# Patient Record
Sex: Male | Born: 1965 | Race: White | Hispanic: No | Smoking: Current every day smoker
Health system: Southern US, Community
[De-identification: ages and names within clinical notes are randomized; demographics above are authoritative.]

## PROBLEM LIST (undated history)

## (undated) DIAGNOSIS — E785 Hyperlipidemia, unspecified: Secondary | ICD-10-CM

## (undated) DIAGNOSIS — G459 Transient cerebral ischemic attack, unspecified: Secondary | ICD-10-CM

## (undated) DIAGNOSIS — I700 Atherosclerosis of aorta: Secondary | ICD-10-CM

## (undated) DIAGNOSIS — I2510 Atherosclerotic heart disease of native coronary artery without angina pectoris: Secondary | ICD-10-CM

## (undated) DIAGNOSIS — I10 Essential (primary) hypertension: Secondary | ICD-10-CM

## (undated) HISTORY — DX: Atherosclerosis of aorta: I70.0

## (undated) HISTORY — DX: Transient cerebral ischemic attack, unspecified: G45.9

## (undated) HISTORY — DX: Hyperlipidemia, unspecified: E78.5

## (undated) HISTORY — DX: Atherosclerotic heart disease of native coronary artery without angina pectoris: I25.10

---

## 2003-04-08 ENCOUNTER — Emergency Department (HOSPITAL_COMMUNITY): Admission: EM | Admit: 2003-04-08 | Discharge: 2003-04-08 | Payer: Self-pay | Admitting: Emergency Medicine

## 2004-07-17 ENCOUNTER — Encounter: Admission: RE | Admit: 2004-07-17 | Discharge: 2004-07-17 | Payer: Self-pay | Admitting: Emergency Medicine

## 2004-07-22 ENCOUNTER — Encounter: Admission: RE | Admit: 2004-07-22 | Discharge: 2004-07-22 | Payer: Self-pay | Admitting: Emergency Medicine

## 2011-06-22 ENCOUNTER — Other Ambulatory Visit: Payer: Self-pay

## 2011-06-22 ENCOUNTER — Encounter (HOSPITAL_COMMUNITY): Payer: Self-pay

## 2011-06-22 ENCOUNTER — Emergency Department (HOSPITAL_COMMUNITY)
Admission: EM | Admit: 2011-06-22 | Discharge: 2011-06-22 | Disposition: A | Discharge status: Home / Self Care | Payer: 59 | Attending: Emergency Medicine | Admitting: Emergency Medicine

## 2011-06-22 ENCOUNTER — Emergency Department (HOSPITAL_COMMUNITY): Payer: 59

## 2011-06-22 DIAGNOSIS — R0602 Shortness of breath: Secondary | ICD-10-CM | POA: Insufficient documentation

## 2011-06-22 DIAGNOSIS — I10 Essential (primary) hypertension: Secondary | ICD-10-CM | POA: Insufficient documentation

## 2011-06-22 DIAGNOSIS — R093 Abnormal sputum: Secondary | ICD-10-CM | POA: Insufficient documentation

## 2011-06-22 DIAGNOSIS — R059 Cough, unspecified: Secondary | ICD-10-CM | POA: Insufficient documentation

## 2011-06-22 DIAGNOSIS — J40 Bronchitis, not specified as acute or chronic: Secondary | ICD-10-CM

## 2011-06-22 DIAGNOSIS — R079 Chest pain, unspecified: Secondary | ICD-10-CM | POA: Insufficient documentation

## 2011-06-22 DIAGNOSIS — R42 Dizziness and giddiness: Secondary | ICD-10-CM | POA: Insufficient documentation

## 2011-06-22 DIAGNOSIS — R000 Tachycardia, unspecified: Secondary | ICD-10-CM | POA: Insufficient documentation

## 2011-06-22 DIAGNOSIS — R05 Cough: Secondary | ICD-10-CM | POA: Insufficient documentation

## 2011-06-22 HISTORY — DX: Essential (primary) hypertension: I10

## 2011-06-22 MED ORDER — BENZONATATE 100 MG PO CAPS: 100.0000 mg | ORAL_CAPSULE | Freq: Three times a day (TID) | ORAL | Status: AC

## 2011-06-22 NOTE — ED Provider Notes (Addendum)
History   This chart was scribed for Frederick Guppy, MD by Melba Coon. The patient was seen in room STRE6/STRE6 and the patient's care was started at 2:04PM.   CSN: [id number]  Arrival date & time 06/22/11  1312   None     Chief Complaint  Patient presents with  . Shortness of Breath    (Consider location/radiation/quality/duration/timing/severity/associated sxs/prior treatment) HPI Frederick Alvarez is a 46 y.o. male who presents to the Emergency Department complaining of persistent, moderate to severe shortness of breath with associated productive cough with an onset yesterday. Pt is producing non-bloody, light green phlegm. Pt's kids have had URI-type sx at home. Lightheadedness and some CP from coughing present. No HA, fever, sweats, neck pain, rash, back pain, abd pain, n/v/d, dysuria, or extremity pain, edema, weakness, numbness, or tingling. No known allergies. Hx of HTN. No other pertinent medical symptoms. Pt is current everyday smoker.   Past Medical History  Diagnosis Date  . Hypertension     History reviewed. No pertinent past surgical history.  History reviewed. No pertinent family history.  History  Substance Use Topics  . Smoking status: Current Everyday Smoker  . Smokeless tobacco: Not on file  . Alcohol Use: Yes     daily beer      Review of Systems 10 Systems reviewed and all are negative for acute change except as noted in the HPI.   Allergies  Review of patient's allergies indicates no known allergies.  Home Medications   Current Outpatient Rx  Name Route Sig Dispense Refill  . AMLODIPINE BESYLATE 5 MG PO TABS Oral Take 5 mg by mouth daily.    Marland Kitchen HYZAAR PO Oral Take 1 tablet by mouth daily.      BP 119/78  Pulse 103  Temp(Src) 97.7 F (36.5 C) (Oral)  Resp 20  SpO2 99%  Physical Exam  Nursing note and vitals reviewed. Constitutional: He is oriented to person, place, and time. He appears well-developed and well-nourished. No  distress.  HENT:  Head: Normocephalic and atraumatic.  Eyes: EOM are normal. Pupils are equal, round, and reactive to light.  Neck: Normal range of motion. Neck supple. No tracheal deviation present.  Cardiovascular: Regular rhythm and normal heart sounds.  Tachycardia present.   No murmur heard. Pulmonary/Chest: Effort normal and breath sounds normal. No respiratory distress.       Lungs sounds are clear and equal bilaterally  Abdominal: Soft. There is no tenderness.  Musculoskeletal: Normal range of motion. He exhibits no tenderness.  Neurological: He is alert and oriented to person, place, and time.  Skin: Skin is warm and dry. No rash noted.  Psychiatric: He has a normal mood and affect. His behavior is normal.    ED Course  Procedures (including critical care time) Productive cough and sob in smoker. No pain. No resp distress or toxicity.  Suspect bronchitis. Will do cxr.    DIAGNOSTIC STUDIES: Oxygen Saturation is 99% on room air, normal by my interpretation.    COORDINATION OF CARE:  2:08PM - EDMD will order CXR for the pt.   Labs Reviewed - No data to display No results found.   No diagnosis found.    MDM  Bronchitis Or distress, hypoxia or toxicity. I personally performed the services described in this documentation, which was scribed in my presence. The recorded information has been reviewed and considered.   I personally performed the services described in this documentation, which was scribed in my presence. The recorded information  has been reviewed and considered.      Frederick Guppy, MD 06/22/11 1443  Frederick Guppy, MD 06/22/11 [phone]

## 2011-06-22 NOTE — Discharge Instructions (Signed)
Your chest x-ray does not show pneumonia.  Your oxygen levels are normal.  Stop smoking.  Use Tessalon for cough.  Followup with your Dr. if your symptoms.  Last more than 3-4 days.  Return for worse or uncontrolled symptoms

## 2011-06-22 NOTE — ED Notes (Signed)
Yesterday head congested, coughing green phleghm, lethagic

## 2012-11-11 ENCOUNTER — Emergency Department (HOSPITAL_COMMUNITY): Payer: 59

## 2012-11-11 ENCOUNTER — Encounter (HOSPITAL_COMMUNITY): Payer: Self-pay | Admitting: *Deleted

## 2012-11-11 ENCOUNTER — Emergency Department (HOSPITAL_COMMUNITY)
Admission: EM | Admit: 2012-11-11 | Discharge: 2012-11-11 | Disposition: A | Discharge status: Home / Self Care | Payer: 59 | Attending: Emergency Medicine | Admitting: Emergency Medicine

## 2012-11-11 DIAGNOSIS — R259 Unspecified abnormal involuntary movements: Secondary | ICD-10-CM | POA: Insufficient documentation

## 2012-11-11 DIAGNOSIS — J029 Acute pharyngitis, unspecified: Secondary | ICD-10-CM | POA: Insufficient documentation

## 2012-11-11 DIAGNOSIS — Z79899 Other long term (current) drug therapy: Secondary | ICD-10-CM | POA: Insufficient documentation

## 2012-11-11 DIAGNOSIS — F17200 Nicotine dependence, unspecified, uncomplicated: Secondary | ICD-10-CM | POA: Insufficient documentation

## 2012-11-11 DIAGNOSIS — R51 Headache: Secondary | ICD-10-CM | POA: Insufficient documentation

## 2012-11-11 DIAGNOSIS — I10 Essential (primary) hypertension: Secondary | ICD-10-CM | POA: Insufficient documentation

## 2012-11-11 DIAGNOSIS — R5381 Other malaise: Secondary | ICD-10-CM | POA: Insufficient documentation

## 2012-11-11 DIAGNOSIS — R42 Dizziness and giddiness: Secondary | ICD-10-CM | POA: Insufficient documentation

## 2012-11-11 DIAGNOSIS — R110 Nausea: Secondary | ICD-10-CM | POA: Insufficient documentation

## 2012-11-11 LAB — COMPREHENSIVE METABOLIC PANEL
AST: 21 U/L (ref 0–37)
Albumin: 4.5 g/dL (ref 3.5–5.2)
Alkaline Phosphatase: 75 U/L (ref 39–117)
BUN: 4 mg/dL — ABNORMAL LOW (ref 6–23)
CO2: 23 mEq/L (ref 19–32)
Chloride: 95 mEq/L — ABNORMAL LOW (ref 96–112)
Potassium: 3.5 mEq/L (ref 3.5–5.1)
Total Bilirubin: 0.7 mg/dL (ref 0.3–1.2)

## 2012-11-11 LAB — DIFFERENTIAL
Basophils Absolute: 0 10*3/uL (ref 0.0–0.1)
Basophils Relative: 1 % (ref 0–1)
Lymphocytes Relative: 37 % (ref 12–46)
Monocytes Relative: 13 % — ABNORMAL HIGH (ref 3–12)
Neutro Abs: 1.7 10*3/uL (ref 1.7–7.7)
Neutrophils Relative %: 48 % (ref 43–77)

## 2012-11-11 LAB — PROTIME-INR: INR: 0.91 (ref 0.00–1.49)

## 2012-11-11 LAB — RAPID STREP SCREEN (MED CTR MEBANE ONLY): Streptococcus, Group A Screen (Direct): NEGATIVE

## 2012-11-11 LAB — CBC
Hemoglobin: 16.8 g/dL (ref 13.0–17.0)
MCHC: 35.9 g/dL (ref 30.0–36.0)
WBC: 3.5 10*3/uL — ABNORMAL LOW (ref 4.0–10.5)

## 2012-11-11 LAB — APTT: aPTT: 28 seconds (ref 24–37)

## 2012-11-11 MED ORDER — SODIUM CHLORIDE 0.9 % IV BOLUS (SEPSIS)
1000.0000 mL | Freq: Once | INTRAVENOUS | Status: AC
  Administered 2012-11-11: 1000 mL via INTRAVENOUS

## 2012-11-11 NOTE — ED Notes (Signed)
Pt with occipital headache, after which he began experiencing bil neck pain.  Since then he is becoming increasingly dizzy and feels "out of it", though he is able to answer all loc questions appropriately.

## 2012-11-11 NOTE — ED Provider Notes (Signed)
CSN: [id number]     Arrival date & time 11/11/12  1105 History   First MD Initiated Contact with Patient 11/11/12 1133     Chief Complaint  Patient presents with  . Dizziness    HPI  And he has a variety of symptoms over the last several days. He works driving a truck. He was at his job last week. He started developing an occipital headache. He was rather indolent this onset over a half hour progressed he returned to the lab that his runs of driving initiate from. He had a break in his day. He started feeling nausea and a throbbing occipital headache. He lay down the back of his car and put her jacket over his head he states that he also took some Tylenol the headache went away over about an hours time and has really not recurred. His neck was stiff and sore the day in the neck but has resolved as well. He's had no tick bites no fevers no rash no arthritis. He's had few episodes of feeling dizzy described as a lightheadedness. Not vertigo. He now states he has a sore throat feels like he has some "mucus in my throat".  He felt "shaky" this morning but not now.  Past Medical History  Diagnosis Date  . Hypertension    History reviewed. No pertinent past surgical history. No family history on file. History  Substance Use Topics  . Smoking status: Current Every Day Smoker  . Smokeless tobacco: Not on file  . Alcohol Use: Yes     Comment: daily beer    Review of Systems  Constitutional: Positive for fatigue. Negative for fever, chills, diaphoresis and appetite change.  HENT: Positive for sore throat. Negative for mouth sores and trouble swallowing.   Eyes: Negative for visual disturbance.  Respiratory: Negative for cough, chest tightness, shortness of breath and wheezing.   Cardiovascular: Negative for chest pain.  Gastrointestinal: Negative for nausea, vomiting, abdominal pain, diarrhea and abdominal distention.  Endocrine: Negative for polydipsia, polyphagia and polyuria.   Genitourinary: Negative for dysuria, frequency and hematuria.  Musculoskeletal: Negative for myalgias, arthralgias and gait problem.  Skin: Negative for color change, pallor and rash.  Neurological: Positive for dizziness, tremors and headaches. Negative for syncope and light-headedness.  Hematological: Does not bruise/bleed easily.  Psychiatric/Behavioral: Negative for behavioral problems and confusion.    Allergies  Review of patient's allergies indicates no known allergies.  Home Medications   Current Outpatient Rx  Name  Route  Sig  Dispense  Refill  . losartan-hydrochlorothiazide (HYZAAR) 100-12.5 MG per tablet   Oral   Take 1 tablet by mouth daily.         Marland Kitchen amLODipine (NORVASC) 5 MG tablet   Oral   Take 5 mg by mouth daily.          BP 120/87  Pulse 73  Temp(Src) 97.8 F (36.6 C) (Oral)  Resp 22  SpO2 98% Physical Exam  Constitutional: He is oriented to person, place, and time. He appears well-developed and well-nourished. No distress.  Awake alert. Normal conversation cognition. Fluent speech.  HENT:  Head: Normocephalic.  History pharynx appears nonerythematous no exudate. Normal uvula. Normal dentition. No swelling to the tongue or floor of the mouth. No symptoms or findings in the neck. No stridor. No lymphadenitis.  Eyes: Conjunctivae are normal. Pupils are equal, round, and reactive to light. No scleral icterus.  Conjunctiva are not pale  Neck: Normal range of motion. Neck supple. No thyromegaly  present.  Supple neck. No meningismus.  Cardiovascular: Normal rate and regular rhythm.  Exam reveals no gallop and no friction rub.   No murmur heard. Pulmonary/Chest: Effort normal and breath sounds normal. No respiratory distress. He has no wheezes. He has no rales.  Clear lungs normal breath sounds  Abdominal: Soft. Bowel sounds are normal. He exhibits no distension. There is no tenderness. There is no rebound.  Musculoskeletal: Normal range of motion.  No  joint swelling or erythema  Neurological: He is alert and oriented to person, place, and time.  No tremor. No asterixis. No pronator drift. Normal strength in 4 extremities. Cranial nerves intact symmetric. No nystagmus.  Skin: Skin is warm and dry. No rash noted.  No skin rash  Psychiatric: He has a normal mood and affect. His behavior is normal.    ED Course  Procedures (including critical care time) Labs Review EKG: Indication dizziness. Interpretation: Normal sinus rhythm slow R wave progression but no change versus comparison 5-12- 2013.  Labs Reviewed  CBC - Abnormal; Notable for the following:    WBC 3.5 (*)    MCV 100.2 (*)    MCH 36.0 (*)    All other components within normal limits  DIFFERENTIAL - Abnormal; Notable for the following:    Monocytes Relative 13 (*)    All other components within normal limits  COMPREHENSIVE METABOLIC PANEL - Abnormal; Notable for the following:    Sodium 133 (*)    Chloride 95 (*)    BUN 4 (*)    All other components within normal limits  RAPID STREP SCREEN  PROTIME-INR  APTT  TROPONIN I  GLUCOSE, CAPILLARY  POCT I-STAT TROPONIN I   Imaging Review No results found.    MDM  No diagnosis found.   Initial labs are normal. Sodium slightly low 133. EKG shows no acute ischemic changes no injury or ectopy. Indication is dizziness. Plan with a headache all day and CT scan. This is not really recurred thus I doubt subarachnoid hemorrhage or hematoma. He had no falls or injuries. He has not had an exam that suggest meningitis and no insect bites fevers or joint pain muscle doubt rickettsial infection. Afebrile and not hypoxemic and clear lungs here. His strep swab is negative and the CT is negative this may be a matter simple symptomatic treatment of viral syndrome. We'll give him some IV fluids and reassess.    Roney Marion, MD 11/11/12 [phone]

## 2012-11-13 LAB — CULTURE, GROUP A STREP

## 2013-09-02 ENCOUNTER — Emergency Department (HOSPITAL_COMMUNITY)
Admission: EM | Admit: 2013-09-02 | Discharge: 2013-09-02 | Disposition: A | Discharge status: Home / Self Care | Payer: 59 | Source: Home / Self Care | Attending: Family Medicine | Admitting: Family Medicine

## 2013-09-02 ENCOUNTER — Encounter (HOSPITAL_COMMUNITY): Payer: Self-pay | Admitting: Emergency Medicine

## 2013-09-02 DIAGNOSIS — J320 Chronic maxillary sinusitis: Secondary | ICD-10-CM

## 2013-09-02 MED ORDER — IPRATROPIUM BROMIDE 0.06 % NA SOLN: 2.0000 | Freq: Four times a day (QID) | NASAL | Status: DC

## 2013-09-02 MED ORDER — AZITHROMYCIN 250 MG PO TABS: 250.0000 mg | ORAL_TABLET | Freq: Every day | ORAL | Status: DC

## 2013-09-02 MED ORDER — PREDNISONE 10 MG PO TABS: 30.0000 mg | ORAL_TABLET | Freq: Every day | ORAL | Status: DC

## 2013-09-02 NOTE — Discharge Instructions (Signed)
Thank you for coming in today. Use the prednisone and atrovent. If not better take the azithromycin antibiotics.  Call or go to the emergency room if you get worse, have trouble breathing, have chest pains, or palpitations.    Sinusitis Sinusitis is redness, soreness, and inflammation of the paranasal sinuses. Paranasal sinuses are air pockets within the bones of your face (beneath the eyes, the middle of the forehead, or above the eyes). In healthy paranasal sinuses, mucus is able to drain out, and air is able to circulate through them by way of your nose. However, when your paranasal sinuses are inflamed, mucus and air can become trapped. This can allow bacteria and other germs to grow and cause infection. Sinusitis can develop quickly and last only a short time (acute) or continue over a long period (chronic). Sinusitis that lasts for more than 12 weeks is considered chronic.  CAUSES  Causes of sinusitis include:  Allergies.  Structural abnormalities, such as displacement of the cartilage that separates your nostrils (deviated septum), which can decrease the air flow through your nose and sinuses and affect sinus drainage.  Functional abnormalities, such as when the small hairs (cilia) that line your sinuses and help remove mucus do not work properly or are not present. SIGNS AND SYMPTOMS  Symptoms of acute and chronic sinusitis are the same. The primary symptoms are pain and pressure around the affected sinuses. Other symptoms include:  Upper toothache.  Earache.  Headache.  Bad breath.  Decreased sense of smell and taste.  A cough, which worsens when you are lying flat.  Fatigue.  Fever.  Thick drainage from your nose, which often is green and may contain pus (purulent).  Swelling and warmth over the affected sinuses. DIAGNOSIS  Your health care provider will perform a physical exam. During the exam, your health care provider may:  Look in your nose for signs of  abnormal growths in your nostrils (nasal polyps).  Tap over the affected sinus to check for signs of infection.  View the inside of your sinuses (endoscopy) using an imaging device that has a light attached (endoscope). If your health care provider suspects that you have chronic sinusitis, one or more of the following tests may be recommended:  Allergy tests.  Nasal culture. A sample of mucus is taken from your nose, sent to a lab, and screened for bacteria.  Nasal cytology. A sample of mucus is taken from your nose and examined by your health care provider to determine if your sinusitis is related to an allergy. TREATMENT  Most cases of acute sinusitis are related to a viral infection and will resolve on their own within 10 days. Sometimes medicines are prescribed to help relieve symptoms (pain medicine, decongestants, nasal steroid sprays, or saline sprays).  However, for sinusitis related to a bacterial infection, your health care provider will prescribe antibiotic medicines. These are medicines that will help kill the bacteria causing the infection.  Rarely, sinusitis is caused by a fungal infection. In theses cases, your health care provider will prescribe antifungal medicine. For some cases of chronic sinusitis, surgery is needed. Generally, these are cases in which sinusitis recurs more than 3 times per year, despite other treatments. HOME CARE INSTRUCTIONS   Drink plenty of water. Water helps thin the mucus so your sinuses can drain more easily.  Use a humidifier.  Inhale steam 3 to 4 times a day (for example, sit in the bathroom with the shower running).  Apply a warm, moist washcloth  to your face 3 to 4 times a day, or as directed by your health care provider.  Use saline nasal sprays to help moisten and clean your sinuses.  Take medicines only as directed by your health care provider.  If you were prescribed either an antibiotic or antifungal medicine, finish it all even if  you start to feel better. SEEK IMMEDIATE MEDICAL CARE IF:  You have increasing pain or severe headaches.  You have nausea, vomiting, or drowsiness.  You have swelling around your face.  You have vision problems.  You have a stiff neck.  You have difficulty breathing. MAKE SURE YOU:   Understand these instructions.  Will watch your condition.  Will get help right away if you are not doing well or get worse. Document Released: 01/27/2005 Document Revised: 06/13/2013 Document Reviewed: 02/11/2011 [facility] Patient Information 2015 [employer], [place]. This information is not intended to replace advice given to you by your health care provider. Make sure you discuss any questions you have with your health care provider.

## 2013-09-02 NOTE — ED Notes (Signed)
C/o sinus States throat and ears does hurt  States she feels drainage in her throat

## 2013-09-02 NOTE — ED Provider Notes (Signed)
Frederick Alvarez is a 48 y.o. male who presents to Urgent Care today for sinus congestion sore throat and chest congestion. Symptoms present for the last 3 weeks and worsening of the past 4-5 days. Patient additionally notes postnasal drip. He denies any fevers or chills nausea vomiting or diarrhea. Cough or shortness of breath. Patient feels well otherwise. He tried over-the-counter medications which have not helped.   Past Medical History  Diagnosis Date  . Hypertension    History  Substance Use Topics  . Smoking status: Current Every Day Smoker  . Smokeless tobacco: Not on file  . Alcohol Use: Yes     Comment: daily beer   ROS as above Medications: No current facility-administered medications for this encounter.   Current Outpatient Prescriptions  Medication Sig Dispense Refill  . amLODipine (NORVASC) 5 MG tablet Take 5 mg by mouth daily.      Marland Kitchen. azithromycin (ZITHROMAX) 250 MG tablet Take 1 tablet (250 mg total) by mouth daily. Take first 2 tablets together, then 1 every day until finished.  6 tablet  0  . ipratropium (ATROVENT) 0.06 % nasal spray Place 2 sprays into both nostrils 4 (four) times daily.  15 mL  1  . losartan-hydrochlorothiazide (HYZAAR) 100-12.5 MG per tablet Take 1 tablet by mouth daily.      . predniSONE (DELTASONE) 10 MG tablet Take 3 tablets (30 mg total) by mouth daily.  15 tablet  0    Exam:  BP 138/75  Pulse 77  Temp(Src) 98.2 F (36.8 C) (Oral)  Resp 16  SpO2 100% Gen: Well NAD HEENT: EOMI,  MMM posterior pharynx cobblestoning. Tympanic membranes are normal appearing bilaterally. Clear nasal discharge. Nontender maxillary sinuses bilaterally. Lungs: Normal work of breathing. CTABL Heart: RRR no MRG Abd: NABS, Soft. Nondistended, Nontender Exts: Brisk capillary refill, warm and well perfused.   No results found for this or any previous visit (from the past 24 hour(s)). No results found.  Assessment and Plan: 48 y.o. male with sinusitis likely  viral. Plan to treat with prednisone and Atrovent nasal spray. Azithromycin for use if not improving.  Discussed warning signs or symptoms. Please see discharge instructions. Patient expresses understanding.   This note was created using Conservation officer, historic buildingsDragon voice recognition software. Any transcription errors are unintended.    Rodolph BongEvan S Nolen Lindamood, MD 09/02/13 [phone]

## 2013-09-04 LAB — CULTURE, GROUP A STREP

## 2014-01-24 ENCOUNTER — Other Ambulatory Visit: Payer: Self-pay | Admitting: Family Medicine

## 2014-01-24 ENCOUNTER — Ambulatory Visit
Admission: RE | Admit: 2014-01-24 | Discharge: 2014-01-24 | Disposition: A | Discharge status: Home / Self Care | Payer: 59 | Source: Ambulatory Visit | Attending: Family Medicine | Admitting: Family Medicine

## 2014-01-24 DIAGNOSIS — R059 Cough, unspecified: Secondary | ICD-10-CM

## 2014-01-24 DIAGNOSIS — R05 Cough: Secondary | ICD-10-CM

## 2015-09-04 ENCOUNTER — Other Ambulatory Visit: Payer: Self-pay | Admitting: Surgery

## 2017-08-20 ENCOUNTER — Encounter (HOSPITAL_BASED_OUTPATIENT_CLINIC_OR_DEPARTMENT_OTHER): Payer: Self-pay | Admitting: Emergency Medicine

## 2017-08-20 ENCOUNTER — Emergency Department (HOSPITAL_BASED_OUTPATIENT_CLINIC_OR_DEPARTMENT_OTHER): Payer: Managed Care, Other (non HMO)

## 2017-08-20 ENCOUNTER — Other Ambulatory Visit: Payer: Self-pay

## 2017-08-20 ENCOUNTER — Emergency Department (HOSPITAL_BASED_OUTPATIENT_CLINIC_OR_DEPARTMENT_OTHER)
Admission: EM | Admit: 2017-08-20 | Discharge: 2017-08-21 | Disposition: A | Discharge status: Home / Self Care | Payer: Managed Care, Other (non HMO) | Attending: Emergency Medicine | Admitting: Emergency Medicine

## 2017-08-20 DIAGNOSIS — Z79899 Other long term (current) drug therapy: Secondary | ICD-10-CM | POA: Insufficient documentation

## 2017-08-20 DIAGNOSIS — I10 Essential (primary) hypertension: Secondary | ICD-10-CM | POA: Diagnosis not present

## 2017-08-20 DIAGNOSIS — F17210 Nicotine dependence, cigarettes, uncomplicated: Secondary | ICD-10-CM | POA: Insufficient documentation

## 2017-08-20 DIAGNOSIS — F1010 Alcohol abuse, uncomplicated: Secondary | ICD-10-CM | POA: Diagnosis not present

## 2017-08-20 DIAGNOSIS — E876 Hypokalemia: Secondary | ICD-10-CM | POA: Insufficient documentation

## 2017-08-20 DIAGNOSIS — R42 Dizziness and giddiness: Secondary | ICD-10-CM | POA: Diagnosis present

## 2017-08-20 DIAGNOSIS — Z789 Other specified health status: Secondary | ICD-10-CM

## 2017-08-20 DIAGNOSIS — E871 Hypo-osmolality and hyponatremia: Secondary | ICD-10-CM | POA: Diagnosis not present

## 2017-08-20 DIAGNOSIS — Z7289 Other problems related to lifestyle: Secondary | ICD-10-CM

## 2017-08-20 LAB — BASIC METABOLIC PANEL
ANION GAP: 8 (ref 5–15)
Anion gap: 12 (ref 5–15)
Anion gap: 8 (ref 5–15)
BUN: 8 mg/dL (ref 6–20)
BUN: 6 mg/dL (ref 6–20)
BUN: 6 mg/dL (ref 6–20)
CALCIUM: 9 mg/dL (ref 8.9–10.3)
CALCIUM: 7.9 mg/dL — AB (ref 8.9–10.3)
CO2: 26 mmol/L (ref 22–32)
CO2: 20 mmol/L — ABNORMAL LOW (ref 22–32)
CO2: 22 mmol/L (ref 22–32)
CREATININE: 0.44 mg/dL — AB (ref 0.61–1.24)
Calcium: 6.6 mg/dL — ABNORMAL LOW (ref 8.9–10.3)
Chloride: 84 mmol/L — ABNORMAL LOW (ref 98–111)
Chloride: 102 mmol/L (ref 98–111)
Chloride: 100 mmol/L (ref 98–111)
Creatinine, Ser: 0.73 mg/dL (ref 0.61–1.24)
Creatinine, Ser: 0.47 mg/dL — ABNORMAL LOW (ref 0.61–1.24)
GFR calc Af Amer: >60 mL/min (ref >60)
GFR calc Af Amer: >60 mL/min (ref >60)
GFR calc non Af Amer: >60 mL/min (ref >60)
GFR calc non Af Amer: >60 mL/min (ref >60)
GLUCOSE: 103 mg/dL — AB (ref 70–99)
GLUCOSE: 84 mg/dL (ref 70–99)
Glucose, Bld: 104 mg/dL — ABNORMAL HIGH (ref 70–99)
Potassium: 3.5 mmol/L (ref 3.5–5.1)
Potassium: 2.7 mmol/L — CL (ref 3.5–5.1)
Potassium: 3.7 mmol/L (ref 3.5–5.1)
SODIUM: 130 mmol/L — AB (ref 135–145)
SODIUM: 130 mmol/L — AB (ref 135–145)
Sodium: 122 mmol/L — ABNORMAL LOW (ref 135–145)

## 2017-08-20 LAB — URINALYSIS, ROUTINE W REFLEX MICROSCOPIC
BILIRUBIN URINE: NEGATIVE
Glucose, UA: NEGATIVE mg/dL
HGB URINE DIPSTICK: NEGATIVE
KETONES UR: 15 mg/dL — AB
Leukocytes, UA: NEGATIVE
NITRITE: NEGATIVE
PH: 7 (ref 5.0–8.0)
Protein, ur: NEGATIVE mg/dL
Specific Gravity, Urine: <1.005 — ABNORMAL LOW (ref 1.005–1.030)

## 2017-08-20 LAB — CBC
HCT: 39.9 % (ref 39.0–52.0)
HEMOGLOBIN: 14.9 g/dL (ref 13.0–17.0)
MCH: 36.8 pg — AB (ref 26.0–34.0)
MCHC: 37.3 g/dL — AB (ref 30.0–36.0)
MCV: 98.5 fL (ref 78.0–100.0)
Platelets: 201 10*3/uL (ref 150–400)
RBC: 4.05 MIL/uL — ABNORMAL LOW (ref 4.22–5.81)
RDW: 12 % (ref 11.5–15.5)
WBC: 7 10*3/uL (ref 4.0–10.5)

## 2017-08-20 LAB — MAGNESIUM
MAGNESIUM: 2.8 mg/dL — AB (ref 1.7–2.4)
Magnesium: 1.3 mg/dL — ABNORMAL LOW (ref 1.7–2.4)

## 2017-08-20 LAB — LIPASE, BLOOD: Lipase: 25 U/L (ref 11–51)

## 2017-08-20 MED ORDER — SODIUM CHLORIDE 0.9 % IV BOLUS
1000.0000 mL | Freq: Once | INTRAVENOUS | Status: AC
Start: 2017-08-20 — End: 2017-08-20
  Administered 2017-08-20: 1000 mL via INTRAVENOUS

## 2017-08-20 MED ORDER — CHLORDIAZEPOXIDE HCL 25 MG PO CAPS: ORAL_CAPSULE | ORAL | 0 refills | Status: DC

## 2017-08-20 MED ORDER — POTASSIUM CHLORIDE CRYS ER 20 MEQ PO TBCR
40.0000 meq | EXTENDED_RELEASE_TABLET | Freq: Once | ORAL | Status: AC
  Administered 2017-08-20: 40 meq via ORAL
  Filled 2017-08-20: qty 2

## 2017-08-20 MED ORDER — SODIUM CHLORIDE 0.9 % IV SOLN
Freq: Once | INTRAVENOUS | Status: AC
  Administered 2017-08-20: 19:00:00 via INTRAVENOUS

## 2017-08-20 MED ORDER — POTASSIUM CHLORIDE 10 MEQ/100ML IV SOLN
10.0000 meq | Freq: Once | INTRAVENOUS | Status: AC
  Administered 2017-08-20: 10 meq via INTRAVENOUS
  Filled 2017-08-20: qty 100

## 2017-08-20 MED ORDER — MAGNESIUM SULFATE 50 % IJ SOLN
2.0000 g | Freq: Once | INTRAMUSCULAR | Status: AC
  Administered 2017-08-20: 2 g via INTRAVENOUS
  Filled 2017-08-20: qty 4

## 2017-08-20 NOTE — ED Provider Notes (Signed)
[facility] EMERGENCY DEPARTMENT Provider Note   CSN: [id number] Arrival date & time: 08/20/17  1440     History   Chief Complaint Chief Complaint  Patient presents with  . Dizziness  . Emesis    HPI Frederick Alvarez is a 52 y.o. male.  HPI  Frederick Alvarez is a 52yo male with a history of hypertension, tobacco use and alcoholism who presents to the emergency department for evaluation of vomiting, weakness, difficulty focusing and lightheadedness. Patient states that his symptoms started four days ago with generalized cramping abdominal pain. He states that pain comes and goes without apparent trigger, about a 3/10 in severity currently. He states that he has vomited twice daily for the past two days. Feels generally weak, fatigued and light headed.  States that he thought he had a fever yesterday, but did not measure his temperature.  States that he takes HCTZ daily.  Also drinks about 8-9 beers daily for many years now.  Last drink yesterday evening.  He denies history of seizure.  Denies any new medication changes recently.  Denies cough, congestion, sore throat, chest pain, shortness of breath, dysuria, urinary frequency, hematuria, melena, hematochezia, syncope, numbness.  Reports that he has been more stressed over the past few months given separation with his wife.  He has had decreased appetite and weight loss.  Denies night sweats or history of cancer.  Denies prior abdominal surgeries.  Is able to ambulate independently.  Past Medical History:  Diagnosis Date  . Hypertension     There are no active problems to display for this patient.   History reviewed. No pertinent surgical history.      Home Medications    Prior to Admission medications   Medication Sig Start Date End Date Taking? Authorizing Provider  amLODipine (NORVASC) 5 MG tablet Take 5 mg by mouth daily.    [provider]  azithromycin (ZITHROMAX) 250 MG tablet Take 1 tablet (250 mg total)  by mouth daily. Take first 2 tablets together, then 1 every day until finished. 09/02/13   Rodolph Bongorey, Evan S, MD  ipratropium (ATROVENT) 0.06 % nasal spray Place 2 sprays into both nostrils 4 (four) times daily. 09/02/13   Rodolph Bongorey, Evan S, MD  losartan-hydrochlorothiazide (HYZAAR) 100-12.5 MG per tablet Take 1 tablet by mouth daily.    [provider]  predniSONE (DELTASONE) 10 MG tablet Take 3 tablets (30 mg total) by mouth daily. 09/02/13   Rodolph Bongorey, Evan S, MD    Family History No family history on file.  Social History Social History   Tobacco Use  . Smoking status: Current Every Day Smoker  . Smokeless tobacco: Never Used  Substance Use Topics  . Alcohol use: Yes    Comment: daily beer  . Drug use: No     Allergies   Patient has no known allergies.   Review of Systems Review of Systems  Constitutional: Positive for fatigue, fever and unexpected weight change. Negative for chills.  HENT: Negative for congestion, sore throat and trouble swallowing.   Eyes: Negative for visual disturbance.  Respiratory: Negative for cough and shortness of breath.   Cardiovascular: Negative for chest pain.  Gastrointestinal: Positive for abdominal pain, nausea and vomiting. Negative for anal bleeding, blood in stool and constipation.  Genitourinary: Negative for difficulty urinating, dysuria and frequency.  Musculoskeletal: Negative for back pain and gait problem.  Skin: Negative for color change.  Neurological: Positive for weakness and light-headedness. Negative for facial asymmetry and numbness.  Psychiatric/Behavioral: Negative for agitation.     Physical Exam Updated Vital Signs BP 104/68   Pulse 77   Temp 98.2 F (36.8 C) (Oral)   Resp 16   Ht 5\' 10"  (1.778 m)   Wt 59 kg (130 lb)   SpO2 98%   BMI 18.65 kg/m   Physical Exam  Constitutional: He is oriented to person, place, and time. He appears well-developed and well-nourished. No distress.  HENT:  Head: Normocephalic and  atraumatic.  Mouth/Throat: Oropharynx is clear and moist. No oropharyngeal exudate.  Eyes: Pupils are equal, round, and reactive to light. Conjunctivae are normal. Right eye exhibits no discharge. Left eye exhibits no discharge.  Neck: Normal range of motion. Neck supple. No thyromegaly present.  Pulmonary/Chest: Effort normal. No respiratory distress.  Rhonchi heard in right middle lobe.  Abdominal: Soft. Bowel sounds are normal. There is no tenderness.  Neurological: He is alert and oriented to person, place, and time. Coordination normal.  Mental Status:  Alert, oriented, thought content appropriate, able to give a coherent history. Speech fluent without evidence of aphasia. Able to follow 2 step commands without difficulty.  Motor:  Normal tone. 5/5 in upper and lower extremities bilaterally including strong and equal grip strength and dorsiflexion/plantar flexion Sensory: Pinprick and light touch normal in all extremities.  Gait: normal gait and balance CV: distal pulses palpable throughout   Skin: Skin is warm and dry. Capillary refill takes less than 2 seconds. He is not diaphoretic.  Psychiatric: He has a normal mood and affect. His behavior is normal.  Nursing note and vitals reviewed.    ED Treatments / Results  Labs (all labs ordered are listed, but only abnormal results are displayed) Labs Reviewed  BASIC METABOLIC PANEL - Abnormal; Notable for the following components:      Result Value   Sodium 122 (*)    Chloride 84 (*)    Glucose, Bld 103 (*)    All other components within normal limits  CBC - Abnormal; Notable for the following components:   RBC 4.05 (*)    MCH 36.8 (*)    MCHC 37.3 (*)    All other components within normal limits  URINALYSIS, ROUTINE W REFLEX MICROSCOPIC - Abnormal; Notable for the following components:   Specific Gravity, Urine <1.005 (*)    Ketones, ur 15 (*)    All other components within normal limits  BASIC METABOLIC PANEL - Abnormal;  Notable for the following components:   Sodium 130 (*)    Potassium 2.7 (*)    CO2 20 (*)    Creatinine, Ser 0.44 (*)    Calcium 6.6 (*)    All other components within normal limits  MAGNESIUM - Abnormal; Notable for the following components:   Magnesium 1.3 (*)    All other components within normal limits  BASIC METABOLIC PANEL - Abnormal; Notable for the following components:   Sodium 130 (*)    Glucose, Bld 104 (*)    Creatinine, Ser 0.47 (*)    Calcium 7.9 (*)    All other components within normal limits  MAGNESIUM - Abnormal; Notable for the following components:   Magnesium 2.8 (*)    All other components within normal limits  LIPASE, BLOOD    EKG EKG Interpretation  Date/Time:  Thursday August 20 2017 14:47:39 EDT Ventricular Rate:  88 PR Interval:  152 QRS Duration: 98 QT Interval:  366 QTC Calculation: 442 R Axis:   86 Text Interpretation:  Normal sinus  rhythm Right atrial enlargement Borderline ECG Confirmed by Tilden Fossa [id number]) on 08/20/2017 2:51:54 PM   Radiology Dg Chest 2 View  Result Date: 08/20/2017 CLINICAL DATA:  Dizziness, vomiting and weakness x4 days. EXAM: CHEST - 2 VIEW COMPARISON:  01/24/2014 FINDINGS: The heart size and mediastinal contours are within normal limits. Mild aortic atherosclerosis without aneurysm. Hyperinflated lungs without acute pulmonary consolidation, CHF, effusion or pneumothorax. The visualized skeletal structures are unremarkable. IMPRESSION: Hyperinflated lungs. No active pulmonary disease. Aortic atherosclerosis. Electronically Signed   By: Tollie Eth M.D.   On: 08/20/2017 19:02    Procedures Procedures (including critical care time)  Medications Ordered in ED Medications  sodium chloride 0.9 % bolus 1,000 mL (0 mLs Intravenous Stopped 08/20/17 1928)  0.9 %  sodium chloride infusion ( Intravenous Stopped 08/20/17 2040)  potassium chloride SA (K-DUR,KLOR-CON) CR tablet 40 mEq (40 mEq Oral Given 08/20/17 2033)  potassium  chloride 10 mEq in 100 mL IVPB (0 mEq Intravenous Stopped 08/20/17 2152)  magnesium sulfate (IV Push/IM) injection 2 g (2 g Intravenous Given 08/20/17 2152)  potassium chloride 10 mEq in 100 mL IVPB (0 mEq Intravenous Stopped 08/20/17 2355)     Initial Impression / Assessment and Plan / ED Course  I have reviewed the triage vital signs and the nursing notes.  Pertinent labs & imaging results that were available during my care of the patient were reviewed by me and considered in my medical decision making (see chart for details).    Patient presents with fatigue, weakness, vomiting and feeling lightheaded.  Initial labs show that he is hyponatremic with sodium 122.  No other major electrolyte abnormalities.  Hyponatremia is likely due to his chronic alcohol use.  He has had some cramping abdominal pain, but no reproducible tenderness on exam.  No concern for surgical abdomen.  Will hold off on imaging of the abdomen for now and recheck. Patient given NS bolus. Will get CXR given some rhonchi heard in right middle lobe and patient is known smoker with hyponatremia and recent unexpected weight loss.   CXR without acute abnormality. No cancerous finding. Otherwisce CBC unremarkable, no leukocytosis or anemia. UA without infection. Plan to repeat BMP after 2L NS fluid bolus.   After 2L fluid bolus patient reports feeling much improved. He denies any symptoms and has no complaints. Denies abdominal pain.  Repeat BMP with improved sodium (Na 130), although now shows hypokalemia (K 2.7). EKG without acute changes. Will get magnesium level and start PO as well as IV potassium.   Magnesium low (1.3). 2g Mag started. Plan to recheck BMP after magnesium and potassium are finished.   Repeat labs show improved electrolytes. Potassium 3.7, sodium 130, magnesium 2.8. On recheck, patient continues to report improvement. We spoke about his alcohol use in detail. He voiced interest in quitting alcohol use cold [place].  I told him that this could cause dangerous withdrawal symptoms and he agreed to take Librium taper at home. I also gave him substance abuse resources. I have counseled him on the importance of a balanced diet. Have asked him to follow up with his PCP in two days for recheck labs. Have also counseled him on reasons to return to the ED and he agrees. Discussed this patient with Dr. Deretha Emory who agrees with plan.   Final Clinical Impressions(s) / ED Diagnoses   Final diagnoses:  Hyponatremia  Hypokalemia  Hypomagnesemia  Alcohol use    ED Discharge Orders        Ordered  chlordiazePOXIDE (LIBRIUM) 25 MG capsule     08/20/17 2348       Kellie Shropshire, PA-C 08/21/17 Vladimir Crofts, MD 08/23/17 [phone]

## 2017-08-20 NOTE — ED Notes (Signed)
Date and time results received: 08/20/17 2026 (use smartphrase ".now" to insert current time)  Test: Potassium  Critical Value: 2.7  Name of Provider Notified:Zaclowski

## 2017-08-20 NOTE — Discharge Instructions (Addendum)
Your electrolytes were off balance today.  Your sodium, magnesium and potassium were all low.  Please try to eat a balanced diet with fruit, vegetables and meat.   You can take Librium at home for the next 3 days to help you stop drinking.  Please do not drink any alcohol while taking this medicine as it can make you very drowsy and slow your breathing.  This medicine can also help with anxiety.  I have listed information to resources for substance abuse.  Please follow-up with your regular doctor in 3 days for recheck.   Return to the ER if you have any new or concerning symptoms like passing out, vomiting, seizure, weakness or fever.

## 2017-08-20 NOTE — ED Triage Notes (Signed)
Pt reports dizziness, vomiting, difficulty focusing, and weakness x 4 days.

## 2018-04-05 ENCOUNTER — Other Ambulatory Visit: Payer: Self-pay

## 2018-04-05 ENCOUNTER — Encounter (HOSPITAL_COMMUNITY): Payer: Self-pay | Admitting: Emergency Medicine

## 2018-04-05 ENCOUNTER — Emergency Department (HOSPITAL_COMMUNITY): Payer: Managed Care, Other (non HMO)

## 2018-04-05 ENCOUNTER — Emergency Department (HOSPITAL_COMMUNITY)
Admission: EM | Admit: 2018-04-05 | Discharge: 2018-04-05 | Disposition: A | Discharge status: Home / Self Care | Payer: Managed Care, Other (non HMO) | Attending: Emergency Medicine | Admitting: Emergency Medicine

## 2018-04-05 DIAGNOSIS — M7918 Myalgia, other site: Secondary | ICD-10-CM

## 2018-04-05 DIAGNOSIS — M542 Cervicalgia: Secondary | ICD-10-CM | POA: Diagnosis not present

## 2018-04-05 DIAGNOSIS — F17210 Nicotine dependence, cigarettes, uncomplicated: Secondary | ICD-10-CM | POA: Insufficient documentation

## 2018-04-05 DIAGNOSIS — R0789 Other chest pain: Secondary | ICD-10-CM | POA: Diagnosis not present

## 2018-04-05 DIAGNOSIS — I10 Essential (primary) hypertension: Secondary | ICD-10-CM | POA: Diagnosis not present

## 2018-04-05 DIAGNOSIS — Z79899 Other long term (current) drug therapy: Secondary | ICD-10-CM | POA: Diagnosis not present

## 2018-04-05 DIAGNOSIS — M25511 Pain in right shoulder: Secondary | ICD-10-CM | POA: Insufficient documentation
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
## 2018-04-05 NOTE — Discharge Instructions (Addendum)

## 2018-08-10 ENCOUNTER — Emergency Department (HOSPITAL_BASED_OUTPATIENT_CLINIC_OR_DEPARTMENT_OTHER)
Admission: EM | Admit: 2018-08-10 | Discharge: 2018-08-10 | Disposition: A | Discharge status: Home / Self Care | Payer: Managed Care, Other (non HMO) | Attending: Emergency Medicine | Admitting: Emergency Medicine

## 2018-08-10 ENCOUNTER — Other Ambulatory Visit: Payer: Self-pay

## 2018-08-10 ENCOUNTER — Emergency Department (HOSPITAL_BASED_OUTPATIENT_CLINIC_OR_DEPARTMENT_OTHER): Payer: Managed Care, Other (non HMO)

## 2018-08-10 ENCOUNTER — Encounter (HOSPITAL_BASED_OUTPATIENT_CLINIC_OR_DEPARTMENT_OTHER): Payer: Self-pay | Admitting: *Deleted

## 2018-08-10 DIAGNOSIS — R42 Dizziness and giddiness: Secondary | ICD-10-CM | POA: Diagnosis not present

## 2018-08-10 DIAGNOSIS — F17210 Nicotine dependence, cigarettes, uncomplicated: Secondary | ICD-10-CM | POA: Diagnosis not present

## 2018-08-10 DIAGNOSIS — I10 Essential (primary) hypertension: Secondary | ICD-10-CM | POA: Insufficient documentation

## 2018-08-10 DIAGNOSIS — Z79899 Other long term (current) drug therapy: Secondary | ICD-10-CM | POA: Diagnosis not present

## 2018-08-10 DIAGNOSIS — R55 Syncope and collapse: Secondary | ICD-10-CM | POA: Diagnosis present

## 2018-08-10 DIAGNOSIS — E871 Hypo-osmolality and hyponatremia: Secondary | ICD-10-CM | POA: Insufficient documentation

## 2018-08-10 LAB — PHOSPHORUS: Phosphorus: 3.2 mg/dL (ref 2.5–4.6)

## 2018-08-10 LAB — HEPATIC FUNCTION PANEL
ALT: 20 U/L (ref 0–44)
AST: 22 U/L (ref 15–41)
Albumin: 4.3 g/dL (ref 3.5–5.0)
Alkaline Phosphatase: 62 U/L (ref 38–126)
Bilirubin, Direct: 0.2 mg/dL (ref 0.0–0.2)
Indirect Bilirubin: 0.7 mg/dL (ref 0.3–0.9)
Total Bilirubin: 0.9 mg/dL (ref 0.3–1.2)
Total Protein: 6.9 g/dL (ref 6.5–8.1)

## 2018-08-10 LAB — BASIC METABOLIC PANEL
Anion gap: 13 (ref 5–15)
BUN: 5 mg/dL — ABNORMAL LOW (ref 6–20)
CO2: 23 mmol/L (ref 22–32)
Calcium: 9.5 mg/dL (ref 8.9–10.3)
Chloride: 91 mmol/L — ABNORMAL LOW (ref 98–111)
Creatinine, Ser: 0.63 mg/dL (ref 0.61–1.24)
GFR calc Af Amer: >60 mL/min (ref >60)
GFR calc non Af Amer: >60 mL/min (ref >60)
Glucose, Bld: 99 mg/dL (ref 70–99)
Potassium: 3.7 mmol/L (ref 3.5–5.1)
Sodium: 127 mmol/L — ABNORMAL LOW (ref 135–145)

## 2018-08-10 LAB — CBC
HCT: 44.2 % (ref 39.0–52.0)
Hemoglobin: 15.9 g/dL (ref 13.0–17.0)
MCH: 36.4 pg — ABNORMAL HIGH (ref 26.0–34.0)
MCHC: 36 g/dL (ref 30.0–36.0)
MCV: 101.1 fL — ABNORMAL HIGH (ref 80.0–100.0)
Platelets: 270 10*3/uL (ref 150–400)
RBC: 4.37 MIL/uL (ref 4.22–5.81)
RDW: 11.9 % (ref 11.5–15.5)
WBC: 5.2 10*3/uL (ref 4.0–10.5)
nRBC: 0 % (ref 0.0–0.2)

## 2018-08-10 LAB — TROPONIN I (HIGH SENSITIVITY)
Troponin I (High Sensitivity): <2 ng/L (ref <18)
Troponin I (High Sensitivity): <2 ng/L (ref <18)

## 2018-08-10 LAB — URINALYSIS, ROUTINE W REFLEX MICROSCOPIC
Bilirubin Urine: NEGATIVE
Glucose, UA: NEGATIVE mg/dL
Hgb urine dipstick: NEGATIVE
Ketones, ur: 15 mg/dL — AB
Leukocytes,Ua: NEGATIVE
Nitrite: NEGATIVE
Protein, ur: NEGATIVE mg/dL
Specific Gravity, Urine: 1.01 (ref 1.005–1.030)
pH: 6 (ref 5.0–8.0)

## 2018-08-10 LAB — RAPID URINE DRUG SCREEN, HOSP PERFORMED
Amphetamines: NOT DETECTED
Barbiturates: NOT DETECTED
Benzodiazepines: NOT DETECTED
Cocaine: NOT DETECTED
Opiates: NOT DETECTED
Tetrahydrocannabinol: NOT DETECTED

## 2018-08-10 LAB — PROTIME-INR
INR: 0.9 (ref 0.8–1.2)
Prothrombin Time: 12.4 seconds (ref 11.4–15.2)

## 2018-08-10 LAB — TSH: TSH: 1.138 u[IU]/mL (ref 0.350–4.500)

## 2018-08-10 LAB — MAGNESIUM: Magnesium: 2 mg/dL (ref 1.7–2.4)

## 2018-08-10 MED ORDER — SODIUM CHLORIDE 0.9 % IV BOLUS (SEPSIS)
1000.0000 mL | Freq: Once | INTRAVENOUS | Status: AC
  Administered 2018-08-10: 15:00:00 1000 mL via INTRAVENOUS

## 2018-08-10 MED ORDER — SODIUM CHLORIDE 0.9 % IV SOLN: 1000.0000 mL | INTRAVENOUS | Status: DC

## 2018-08-10 NOTE — ED Notes (Signed)
Patient transported to X-ray 

## 2018-08-10 NOTE — ED Notes (Signed)
Pt states has had several changes in blood pressure medications over the past month, has not been able to get an appointment to see pmd recently due to covid precautions at the office.  PMD made changes due to unavailability of some of his previous medications

## 2018-08-10 NOTE — ED Notes (Signed)
MD at bedside. 

## 2018-08-10 NOTE — ED Provider Notes (Signed)
[facility] EMERGENCY DEPARTMENT Provider Note   CSN: [id number] Arrival date & time: 08/10/18  1145     History   Chief Complaint Chief Complaint  Patient presents with  . Near Syncope    HPI Frederick Alvarez is a 53 y.o. male.     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  He reports he started to get lightheaded and weak.  He felt like he might be getting close to passing out but he did not.  He walked up to a shaded area and had a couple glasses of water.  He reports he felt better.  He denied he had any chest pain or shortness of breath.  No headache.  He reports he did feel better but today he continues to feel just a little fatigued and not quite like himself.  He has not developed any chest pain or headache.  He does not feel short of breath.  He has not had a fever.  No cough.  No vomiting or diarrhea.  He reports that he is taking his blood pressure medications as prescribed every day.  He took it yesterday and today.  He reports that his medications have been changed around a bit due to lack of supply with coronavirus.  He reports now he has 3 medications, hydrochlorothiazide, losartan and amlodipine.  He reports his blood pressures are low from his baseline.  At baseline he reports his pressures will run at about 132 systolic when he is taking his medications.  Now, he reports they are down at the low 100s.  Patient reports he does drink about a sixpack of beer per day and smokes close to a pack of cigarettes per day.  He denies he is felt sick recently.  No body aches, general fatigue. Past Medical History:  Diagnosis Date  . Hypertension     There are no active problems to display for this patient.   History reviewed. No pertinent surgical history.      Home Medications    Prior to Admission medications   Medication Sig Start Date End Date Taking? Authorizing Provider  amLODipine (NORVASC)  5 MG tablet Take 5 mg by mouth daily.   Yes [provider]  hydrochlorothiazide (HYDRODIURIL) 12.5 MG tablet Take 12.5 mg by mouth daily.   Yes [provider]  olmesartan (BENICAR) 40 MG tablet Take 40 mg by mouth daily.   Yes [provider]  azithromycin (ZITHROMAX) 250 MG tablet Take 1 tablet (250 mg total) by mouth daily. Take first 2 tablets together, then 1 every day until finished. 09/02/13   Gregor Hams, MD  chlordiazePOXIDE (LIBRIUM) 25 MG capsule 50mg  PO TID x 1D, then 50mg  PO BID X 1D, then 50mg  PO QD X 1D 08/20/17   Glyn Ade, PA-C  ipratropium (ATROVENT) 0.06 % nasal spray Place 2 sprays into both nostrils 4 (four) times daily. 09/02/13   Gregor Hams, MD  losartan-hydrochlorothiazide (HYZAAR) 100-12.5 MG per tablet Take 1 tablet by mouth daily.    [provider]  predniSONE (DELTASONE) 10 MG tablet Take 3 tablets (30 mg total) by mouth daily. 09/02/13   Gregor Hams, MD    Family History No family history on file.  Social History Social History   Tobacco Use  . Smoking status: Current Every Day Smoker    Types: Cigarettes  . Smokeless tobacco: Never Used  Substance Use Topics  .  Alcohol use: Yes    Comment: daily beer  . Drug use: No     Allergies   Patient has no known allergies.   Review of Systems Review of Systems 10 Systems reviewed and are negative for acute change except as noted in the HPI.   Physical Exam Updated Vital Signs BP 111/81 (BP Location: Right Arm)   Pulse 86   Temp 98.2 F (36.8 C) (Oral)   Resp 13   Ht 5\' 10"  (1.778 m)   Wt 54.4 kg   SpO2 99%   BMI 17.22 kg/m   Physical Exam Constitutional:      Appearance: He is well-developed.     Comments: No respiratory distress.  Alert.  No acute distress.  HENT:     Head: Normocephalic and atraumatic.     Mouth/Throat:     Mouth: Mucous membranes are moist.     Pharynx: Oropharynx is clear.  Eyes:     Extraocular Movements: Extraocular  movements intact.     Conjunctiva/sclera: Conjunctivae normal.     Pupils: Pupils are equal, round, and reactive to light.  Neck:     Musculoskeletal: Neck supple.  Cardiovascular:     Rate and Rhythm: Normal rate and regular rhythm.     Heart sounds: Normal heart sounds.  Pulmonary:     Effort: Pulmonary effort is normal.     Breath sounds: Normal breath sounds.  Abdominal:     General: Bowel sounds are normal. There is no distension.     Palpations: Abdomen is soft.     Tenderness: There is no abdominal tenderness.  Musculoskeletal: Normal range of motion.     Right lower leg: No edema.     Left lower leg: No edema.  Skin:    General: Skin is warm and dry.  Neurological:     General: No focal deficit present.     Mental Status: He is alert and oriented to person, place, and time.     GCS: GCS eye subscore is 4. GCS verbal subscore is 5. GCS motor subscore is 6.     Coordination: Coordination normal.      ED Treatments / Results  Labs (all labs ordered are listed, but only abnormal results are displayed) Labs Reviewed  BASIC METABOLIC PANEL - Abnormal; Notable for the following components:      Result Value   Sodium 127 (*)    Chloride 91 (*)    BUN 5 (*)    All other components within normal limits  CBC - Abnormal; Notable for the following components:   MCV 101.1 (*)    MCH 36.4 (*)    All other components within normal limits  URINALYSIS, ROUTINE W REFLEX MICROSCOPIC - Abnormal; Notable for the following components:   Ketones, ur 15 (*)    All other components within normal limits  TROPONIN I (HIGH SENSITIVITY)  PROTIME-INR  HEPATIC FUNCTION PANEL  RAPID URINE DRUG SCREEN, HOSP PERFORMED  MAGNESIUM  PHOSPHORUS  TROPONIN I (HIGH SENSITIVITY)  TSH    EKG EKG Interpretation  Date/Time:  Tuesday August 10 2018 12:08:18 EDT Ventricular Rate:  68 PR Interval:    QRS Duration: 94 QT Interval:  398 QTC Calculation: 424 R Axis:   83 Text Interpretation:   Sinus rhythm Anterior infarct, old t wave peaking, present on oldest tracing, pronounced compared to most recent tracing Confirmed by Arby BarrettePfeiffer, Aneesh Faller [phone]([id number]) on 08/10/2018 12:49:00 PM   Radiology Dg Chest 2 View  Result Date: 08/10/2018  CLINICAL DATA:  Near syncope.  Lightheaded since yesterday. EXAM: CHEST - 2 VIEW COMPARISON:  04/05/2018 FINDINGS: Heart size is normal. There is aortic atherosclerosis. There is pulmonary hyperinflation suggesting emphysema. Infiltrate, collapse or effusion. No pulmonary edema. No acute bone finding. IMPRESSION: Probable emphysema.  No acute/active process. Electronically Signed   By: Paulina FusiMark  Shogry M.D.   On: 08/10/2018 14:43    Procedures Procedures (including critical care time)  Medications Ordered in ED Medications  sodium chloride 0.9 % bolus 1,000 mL (1,000 mLs Intravenous New Bag/Given 08/10/18 1437)    Followed by  0.9 %  sodium chloride infusion (has no administration in time range)     Initial Impression / Assessment and Plan / ED Course  I have reviewed the triage vital signs and the nursing notes.  Pertinent labs & imaging results that were available during my care of the patient were reviewed by me and considered in my medical decision making (see chart for details).       Patient presents with lightheadedness/pre-syncope feeling yesterday.  Continue to feel somewhat fatigued today and just not quite himself.  His blood pressures are in the low 100s.  He is on hydrochlorothiazide and also has a fairly large amount of alcohol consumption on a daily basis.  This may account for hyponatremia which has been seen on previous evaluations.  Appears likely the patient has a combination of dehydration with low blood pressure due to medications.  And will be to rehydrate and obtain repeat troponins.  First are normal.  EKG has a peaked appearing T wave however this has been present on older tracings as well.  Patient denies any chest pain or shortness of  breath.  Patient will need reassessment.  If his blood pressures are improved and symptoms resolved, will be appropriate for close follow-up with PCP for monitoring of electrolytes, blood pressure and further outpatient cardiac work-up if indicated.  Dr. Dalene SeltzerSchlossman to reassess for final disposition.  Final Clinical Impressions(s) / ED Diagnoses   Final diagnoses:  None    ED Discharge Orders    None       Arby BarrettePfeiffer, Dnasia Gauna, MD 08/10/18 1520

## 2018-08-10 NOTE — ED Provider Notes (Signed)
Received care of patient from Dr. Vallery Ridge.  Please see her note for prior history and physical exam.  Briefly this is a 53 year old male who presented with concern for lightheadedness.  Awaiting delta troponin.  Delta troponin negative.  Advised to hold hydrochlorothiazide in setting of hyponatremia.  Recommend follow-up with primary care physician.   Gareth Morgan, MD 08/11/18 1345

## 2018-08-10 NOTE — ED Triage Notes (Signed)
Yesterday he was working outside and almost passed out. This am he feels weak, and not himself.

## 2018-10-19 ENCOUNTER — Other Ambulatory Visit: Payer: Self-pay

## 2018-10-19 ENCOUNTER — Encounter (HOSPITAL_BASED_OUTPATIENT_CLINIC_OR_DEPARTMENT_OTHER): Payer: Self-pay | Admitting: *Deleted

## 2018-10-19 ENCOUNTER — Emergency Department (HOSPITAL_BASED_OUTPATIENT_CLINIC_OR_DEPARTMENT_OTHER)
Admission: EM | Admit: 2018-10-19 | Discharge: 2018-10-19 | Disposition: A | Discharge status: Home / Self Care | Payer: Managed Care, Other (non HMO) | Attending: Emergency Medicine | Admitting: Emergency Medicine

## 2018-10-19 DIAGNOSIS — F17210 Nicotine dependence, cigarettes, uncomplicated: Secondary | ICD-10-CM | POA: Diagnosis not present

## 2018-10-19 DIAGNOSIS — K118 Other diseases of salivary glands: Secondary | ICD-10-CM | POA: Diagnosis not present

## 2018-10-19 DIAGNOSIS — Z79899 Other long term (current) drug therapy: Secondary | ICD-10-CM | POA: Diagnosis not present

## 2018-10-19 DIAGNOSIS — R220 Localized swelling, mass and lump, head: Secondary | ICD-10-CM | POA: Diagnosis present

## 2018-10-19 DIAGNOSIS — I10 Essential (primary) hypertension: Secondary | ICD-10-CM | POA: Diagnosis not present

## 2018-10-19 DIAGNOSIS — K029 Dental caries, unspecified: Secondary | ICD-10-CM

## 2018-10-19 MED ORDER — AMOXICILLIN 500 MG PO CAPS: 500.0000 mg | ORAL_CAPSULE | Freq: Three times a day (TID) | ORAL | 0 refills | Status: DC

## 2018-10-19 NOTE — ED Provider Notes (Signed)
[facility] EMERGENCY DEPARTMENT Provider Note   CSN: [id number] Arrival date & time: 10/19/18  1104     History   Chief Complaint Chief Complaint  Patient presents with   Facial Swelling    HPI Frederick Alvarez is a 53 y.o. male.     Patient is a 54 year old male with a history of tobacco abuse and daily beer drinker and hypertension who is presenting today with swelling in the right side of his face.  Patient states that he has had slight more congestion lately but otherwise is having no shortness of breath, chest pain, facial pain, fever or neck pain.  Patient states that when he woke up this morning he just noticed there was some swelling on the right side of his face.  He has no ear pain no difficulty swallowing or dental pain at this time.  He does have a broken tooth in the right upper jaw that he is following up with his dentist soon but he has not had any problem with that tooth.  He denies any sores or lesions in his mouth.  The history is provided by the patient.    Past Medical History:  Diagnosis Date   Hypertension     There are no active problems to display for this patient.   History reviewed. No pertinent surgical history.      Home Medications    Prior to Admission medications   Medication Sig Start Date End Date Taking? Authorizing Provider  amLODipine (NORVASC) 5 MG tablet Take 5 mg by mouth daily.   Yes [provider]  hydrochlorothiazide (HYDRODIURIL) 12.5 MG tablet Take 12.5 mg by mouth daily.   Yes [provider]  ipratropium (ATROVENT) 0.06 % nasal spray Place 2 sprays into both nostrils 4 (four) times daily. 09/02/13  Yes Gregor Hams, MD  amoxicillin (AMOXIL) 500 MG capsule Take 1 capsule (500 mg total) by mouth 3 (three) times daily. 10/19/18   Blanchie Dessert, MD  azithromycin (ZITHROMAX) 250 MG tablet Take 1 tablet (250 mg total) by mouth daily. Take first 2 tablets together, then 1 every day until finished.  09/02/13   Gregor Hams, MD  chlordiazePOXIDE (LIBRIUM) 25 MG capsule 50mg  PO TID x 1D, then 50mg  PO BID X 1D, then 50mg  PO QD X 1D 08/20/17   Glyn Ade, PA-C  losartan-hydrochlorothiazide (HYZAAR) 100-12.5 MG per tablet Take 1 tablet by mouth daily.    [provider]  olmesartan (BENICAR) 40 MG tablet Take 40 mg by mouth daily.    [provider]  predniSONE (DELTASONE) 10 MG tablet Take 3 tablets (30 mg total) by mouth daily. 09/02/13   Gregor Hams, MD    Family History No family history on file.  Social History Social History   Tobacco Use   Smoking status: Current Every Day Smoker    Types: Cigarettes   Smokeless tobacco: Never Used  Substance Use Topics   Alcohol use: Yes    Comment: daily beer   Drug use: No     Allergies   Patient has no known allergies.   Review of Systems Review of Systems  All other systems reviewed and are negative.    Physical Exam Updated Vital Signs BP (!) 135/95 (BP Location: Left Arm)    Pulse 89    Temp 98.8 F (37.1 C) (Oral)    Resp 18    Ht 5\' 10"  (1.778 m)    Wt 61.2 kg  SpO2 100%    BMI 19.37 kg/m   Physical Exam Vitals signs and nursing note reviewed.  Constitutional:      General: He is not in acute distress.    Appearance: Normal appearance. He is normal weight.  HENT:     Head: Normocephalic.      Right Ear: Tympanic membrane normal.     Left Ear: Tympanic membrane normal.     Nose: Nose normal.     Mouth/Throat:     Mouth: Mucous membranes are moist.     Tongue: No lesions.     Pharynx: Oropharynx is clear.   Cardiovascular:     Rate and Rhythm: Normal rate.  Pulmonary:     Effort: Pulmonary effort is normal.  Skin:    General: Skin is warm.  Neurological:     General: No focal deficit present.     Mental Status: He is alert and oriented to person, place, and time. Mental status is at baseline.  Psychiatric:        Mood and Affect: Mood normal.        Thought Content:  Thought content normal.      ED Treatments / Results  Labs (all labs ordered are listed, but only abnormal results are displayed) Labs Reviewed - No data to display  EKG None  Radiology No results found.  Procedures Procedures (including critical care time)  Medications Ordered in ED Medications - No data to display   Initial Impression / Assessment and Plan / ED Course  I have reviewed the triage vital signs and the nursing notes.  Pertinent labs & imaging results that were available during my care of the patient were reviewed by me and considered in my medical decision making (see chart for details).        Patient is a pleasant 53 year old gentleman presenting today with fullness in the right parotid area of his face.  He states he noticed it this morning when he woke up.  There is no significant pain but just the swelling.  He does have a broken tooth in the right upper third molar that he is planning on seeing the dentist for but denies any pain of the tooth and has no localized gum swelling.  The parotid gland appears enlarged but is not firm, indurated or painful.  Suspect most likely a parotid gland stone as he has no ear involvement and only mild congestion with low suspicion that this is sinus or infectious.  Patient is a smoker and was cautioned to follow-up in 1 to 2 weeks if his symptoms do not improve with sour candy to ensure this is not cancerous.  He was also given a prescription for antibiotic that he could start if he started developing dental pain worsening swelling or redness of his face.  Final Clinical Impressions(s) / ED Diagnoses   Final diagnoses:  Parotid gland fullness  Dental decay    ED Discharge Orders         Ordered    amoxicillin (AMOXIL) 500 MG capsule  3 times daily     10/19/18 1134           Gwyneth SproutPlunkett, Brigit Doke, MD 10/19/18 1140

## 2018-10-19 NOTE — ED Triage Notes (Signed)
He woke with numbness to the right side of his face by his ear. No facial droop. No pain. A few days ago he felt like he had water in his right ear.

## 2018-10-19 NOTE — ED Notes (Signed)
ED Provider at bedside. 

## 2018-10-19 NOTE — Discharge Instructions (Signed)
You most likely have a salivary gland stone.  Sure you were sucking on really sour things to help make a lot of saliva to improve the swelling.  However if in the next 2 to 3 days you start developing a lot of pain over that back tooth start having worsening swelling or any redness start the antibiotic.  If symptoms just do not change you need to follow-up with your doctor in 1 week for recheck and possible imaging

## 2021-03-21 IMAGING — CR CHEST - 2 VIEW
2 series · 2 of 2 positions shown · non-contrast
Comparison: 04/05/2018

CLINICAL DATA: Near syncope.  Lightheaded since yesterday.

EXAM:
CHEST - 2 VIEW

[w chest pa]
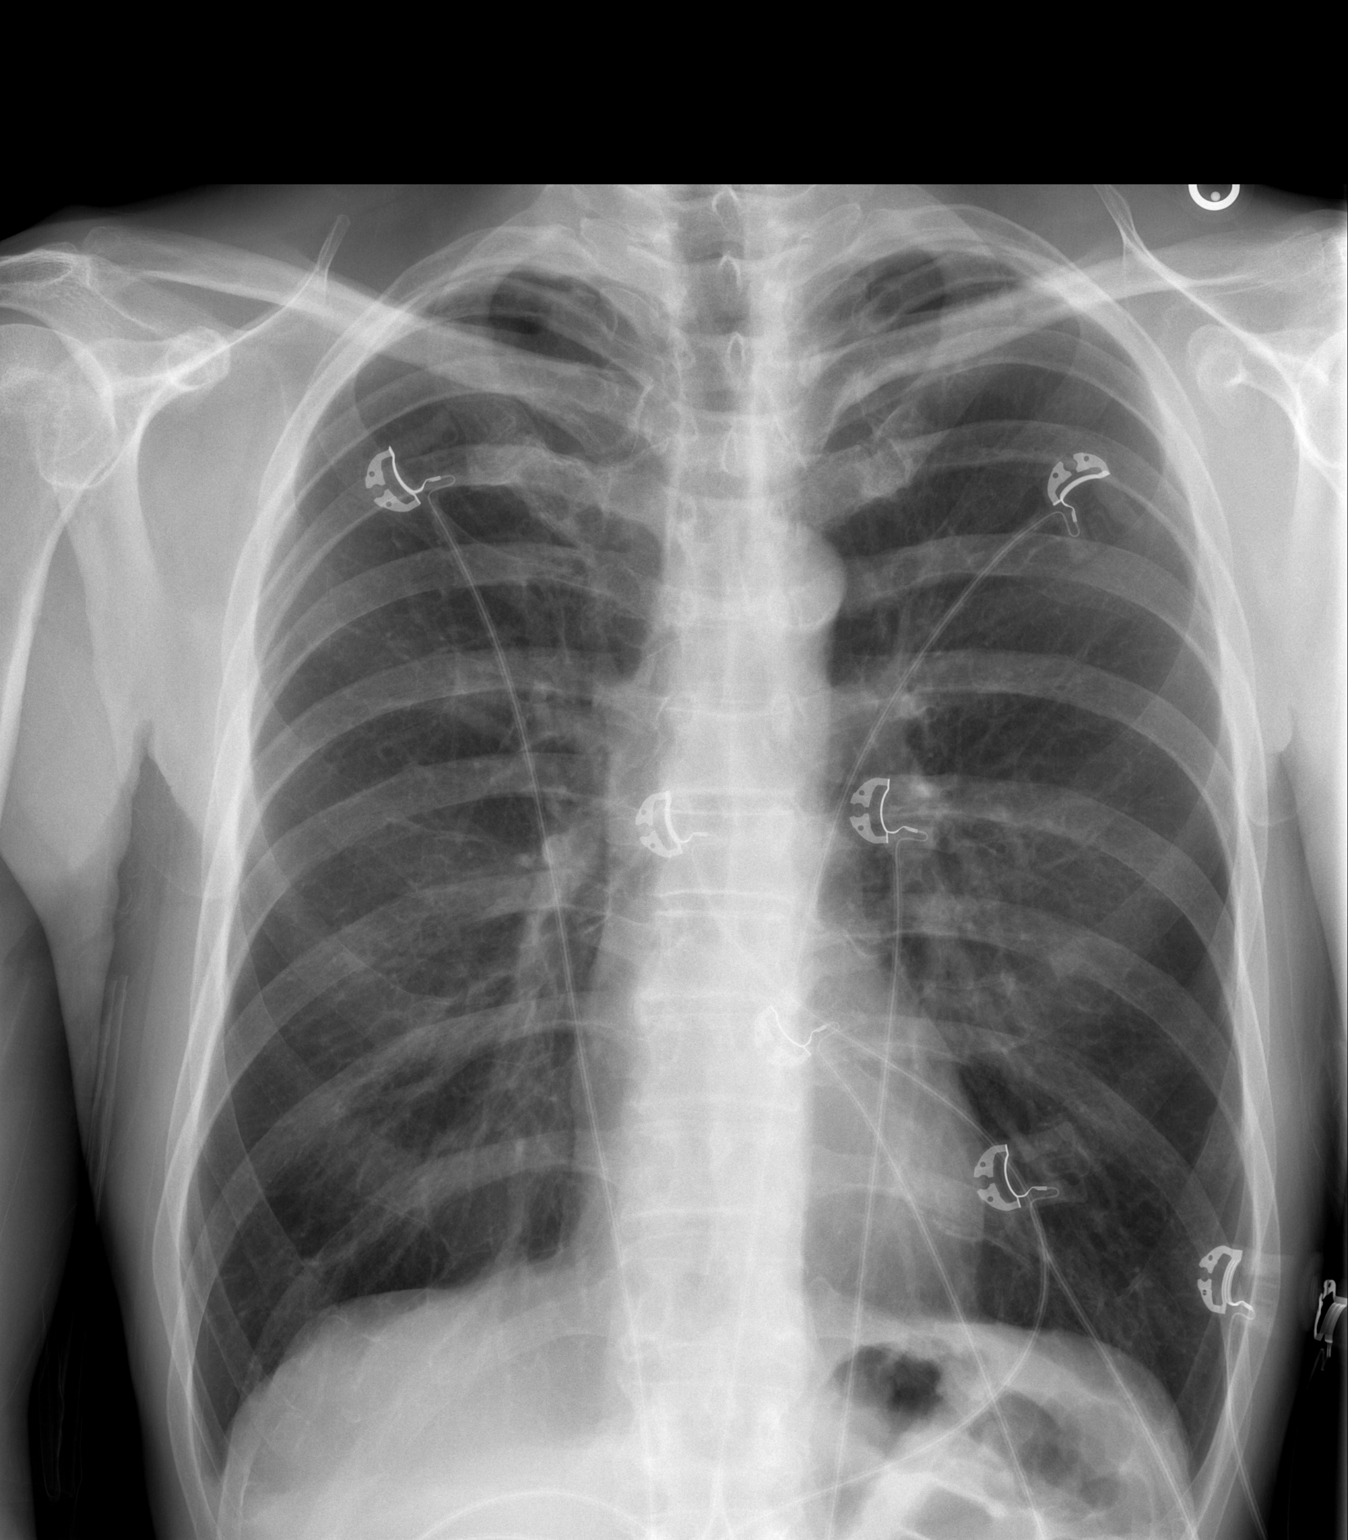

[w chest lat]
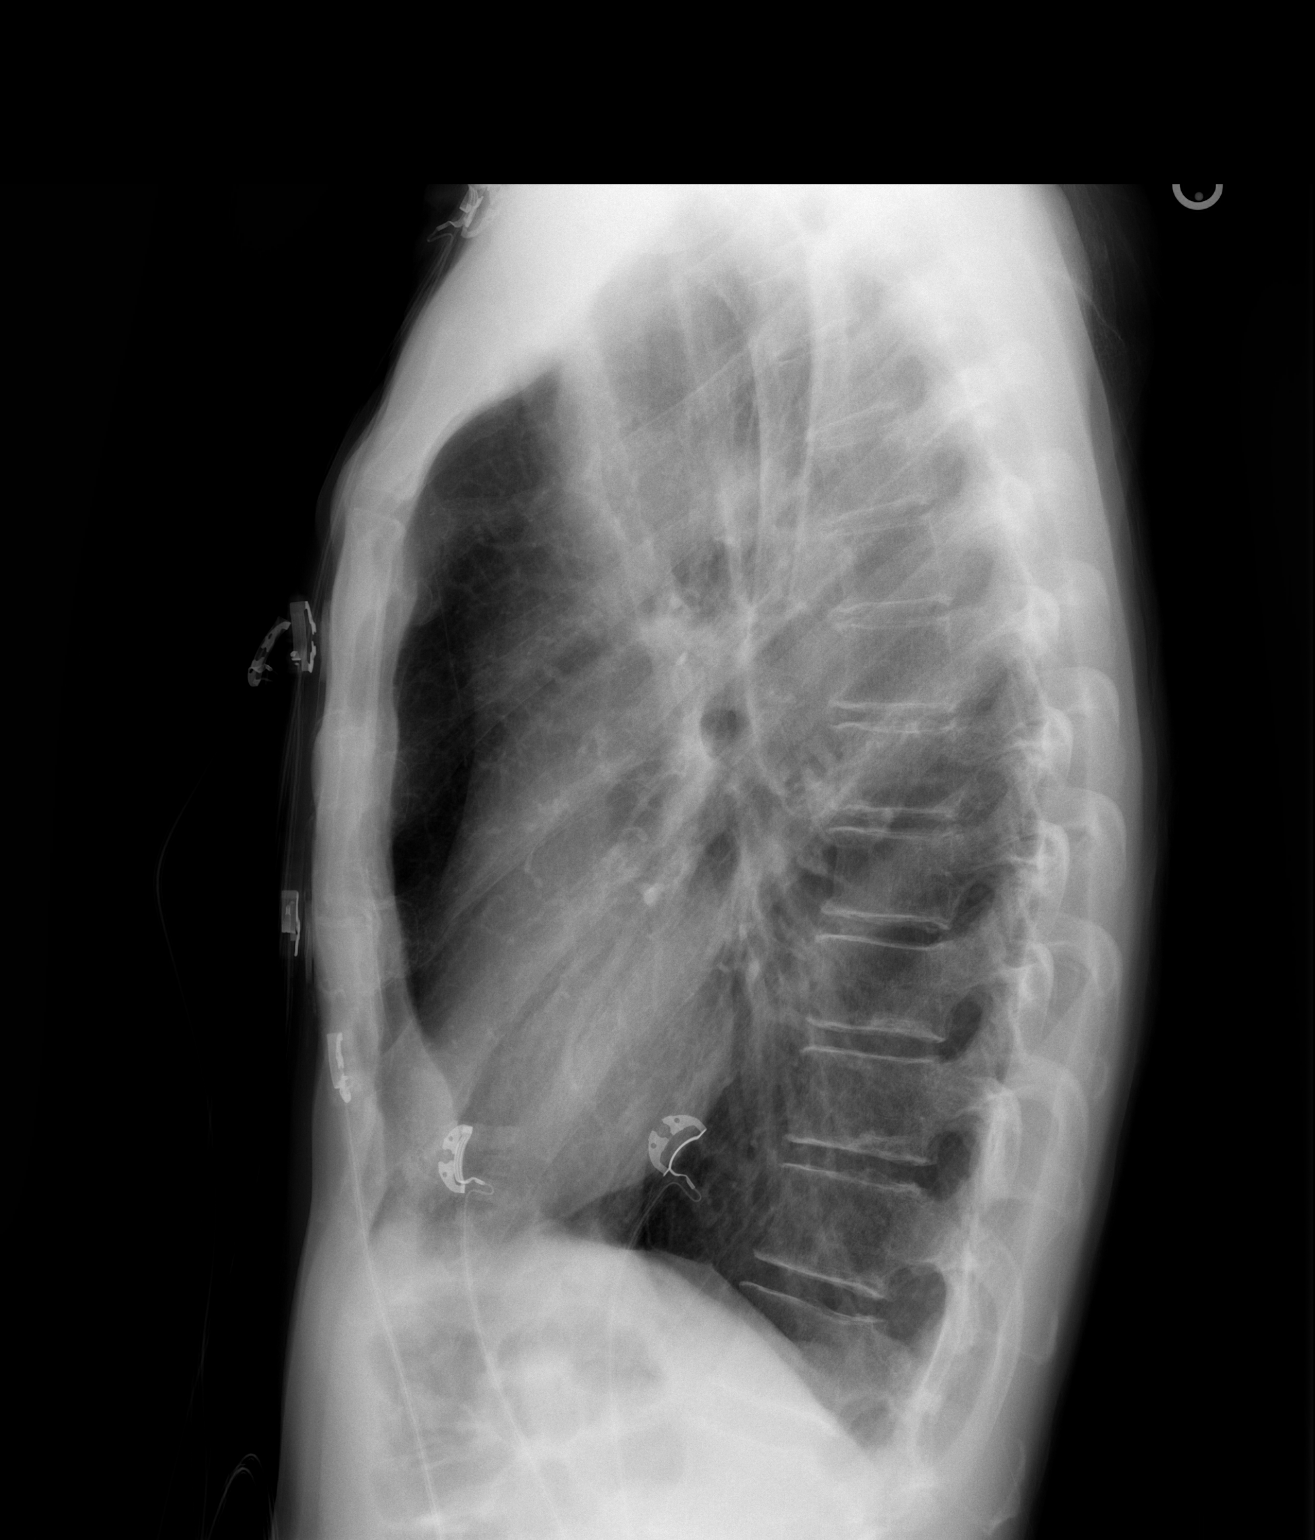

[2 of 2 positions shown; findings below may reference images not displayed]

FINDINGS: Heart size is normal. There is aortic atherosclerosis. There is
pulmonary hyperinflation suggesting emphysema. Infiltrate, collapse
or effusion. No pulmonary edema. No acute bone finding.
IMPRESSION: Probable emphysema.  No acute/active process.

## 2021-06-28 ENCOUNTER — Other Ambulatory Visit: Payer: Self-pay | Admitting: Physician Assistant

## 2021-06-28 DIAGNOSIS — Z720 Tobacco use: Secondary | ICD-10-CM

## 2022-03-21 ENCOUNTER — Emergency Department
Admission: EM | Admit: 2022-03-21 | Discharge: 2022-03-21 | Disposition: A | Discharge status: Home / Self Care | Payer: Managed Care, Other (non HMO) | Attending: Emergency Medicine | Admitting: Emergency Medicine

## 2022-03-21 DIAGNOSIS — H538 Other visual disturbances: Secondary | ICD-10-CM | POA: Insufficient documentation

## 2022-03-21 DIAGNOSIS — R42 Dizziness and giddiness: Secondary | ICD-10-CM | POA: Diagnosis not present

## 2022-03-21 LAB — CBC
HCT: 37 % — ABNORMAL LOW (ref 39.0–52.0)
Hemoglobin: 13.1 g/dL (ref 13.0–17.0)
MCH: 36.4 pg — ABNORMAL HIGH (ref 26.0–34.0)
MCHC: 35.4 g/dL (ref 30.0–36.0)
MCV: 102.8 fL — ABNORMAL HIGH (ref 80.0–100.0)
Platelets: 268 10*3/uL (ref 150–400)
RBC: 3.6 MIL/uL — ABNORMAL LOW (ref 4.22–5.81)
RDW: 12.8 % (ref 11.5–15.5)
WBC: 4.9 10*3/uL (ref 4.0–10.5)
nRBC: 0 % (ref 0.0–0.2)

## 2022-03-21 LAB — BASIC METABOLIC PANEL
Anion gap: 11 (ref 5–15)
BUN: <5 mg/dL — ABNORMAL LOW (ref 6–20)
CO2: 24 mmol/L (ref 22–32)
Calcium: 8.6 mg/dL — ABNORMAL LOW (ref 8.9–10.3)
Chloride: 92 mmol/L — ABNORMAL LOW (ref 98–111)
Creatinine, Ser: 0.64 mg/dL (ref 0.61–1.24)
GFR, Estimated: >60 mL/min (ref >60)
Glucose, Bld: 116 mg/dL — ABNORMAL HIGH (ref 70–99)
Potassium: 3.6 mmol/L (ref 3.5–5.1)
Sodium: 127 mmol/L — ABNORMAL LOW (ref 135–145)

## 2022-03-21 LAB — TROPONIN I (HIGH SENSITIVITY)
Troponin I (High Sensitivity): 4 ng/L (ref <18)
Troponin I (High Sensitivity): 3 ng/L (ref <18)

## 2022-03-21 LAB — MAGNESIUM: Magnesium: 1.9 mg/dL (ref 1.7–2.4)

## 2022-03-21 LAB — URINALYSIS, ROUTINE W REFLEX MICROSCOPIC
Bilirubin Urine: NEGATIVE
Glucose, UA: NEGATIVE mg/dL
Hgb urine dipstick: NEGATIVE
Ketones, ur: NEGATIVE mg/dL
Leukocytes,Ua: NEGATIVE
Nitrite: NEGATIVE
Protein, ur: NEGATIVE mg/dL
Specific Gravity, Urine: 1.008 (ref 1.005–1.030)
pH: 5 (ref 5.0–8.0)

## 2022-03-21 LAB — VITAMIN B12: Vitamin B-12: 329 pg/mL (ref 180–914)

## 2022-03-21 LAB — ETHANOL: Alcohol, Ethyl (B): <10 mg/dL (ref <10)

## 2022-03-21 MED ORDER — THIAMINE HCL 100 MG/ML IJ SOLN
100.0000 mg | Freq: Once | INTRAMUSCULAR | Status: AC
  Administered 2022-03-21: 100 mg via INTRAVENOUS
  Filled 2022-03-21: qty 2

## 2022-03-21 MED ORDER — THIAMINE HCL 100 MG PO TABS: 100.0000 mg | ORAL_TABLET | Freq: Every day | ORAL | 2 refills | Status: DC

## 2022-03-21 MED ORDER — SODIUM CHLORIDE 0.9 % IV SOLN
1.0000 mg | Freq: Once | INTRAVENOUS | Status: AC
  Administered 2022-03-21: 1 mg via INTRAVENOUS
  Filled 2022-03-21: qty 0.2

## 2022-03-21 MED ORDER — DIAZEPAM 5 MG PO TABS
10.0000 mg | ORAL_TABLET | Freq: Once | ORAL | Status: AC
  Administered 2022-03-21: 10 mg via ORAL
  Filled 2022-03-21: qty 2

## 2022-03-21 MED ORDER — FOLIC ACID 1 MG PO TABS: 1.0000 mg | ORAL_TABLET | Freq: Every day | ORAL | 3 refills | Status: DC

## 2022-03-21 MED ORDER — FOLIC ACID 1 MG PO TABS: 1.0000 mg | ORAL_TABLET | Freq: Every day | ORAL | 3 refills | Status: AC

## 2022-03-21 MED ORDER — THIAMINE HCL 100 MG PO TABS: 100.0000 mg | ORAL_TABLET | Freq: Every day | ORAL | 2 refills | Status: AC

## 2022-03-21 NOTE — Discharge Instructions (Addendum)
Thank you for choosing [facility] for your health care today!  Please see your primary doctor this week for a follow up appointment.   Sometimes, in the early stages of certain disease courses it is difficult to detect in the emergency department evaluation -- so, it is important that you continue to monitor your symptoms and call your doctor right away or return to the emergency department if you develop any new or worsening symptoms.  Please go to the following website to schedule new (and existing) patient appointments:   [email or web address]  If you do not have a primary doctor try calling the following clinics to establish care:  If you have insurance:  [facility] [phone] [place] [address]   [facility]  [phone] [place] [place] [address]   If you do not have insurance:  [facility]  [phone] [address], [place] [address]   The following is another list of primary care offices in the area who are accepting new patients at this time.  Please reach out to one of them directly and let them know you would like to schedule an appointment to follow up on an Emergency Department visit, and/or to establish a new primary care provider (PCP).  There are likely other primary care clinics in the are who are accepting new patients, but this is an excellent place to start:  [facility] physician: Dr Lavon Paganini [address] [place], [place] [address] [phone]  [facility] Lead Physician: Dr Steele Sizer [address], [place], [place] [address] [phone]  [place] Physician: Dr Park Liter [address] [place], [place] [address] [phone]  [facility] Lead Physician: Dr Dewaine Oats [place], [place], [place] [address] [phone]  [facility] Physician: Dr Halina Maidens [address] [place], [place] [address] [phone]   It was my pleasure to care for you today.   Hoover Brunette Jacelyn Grip, MD

## 2022-03-21 NOTE — ED Triage Notes (Signed)
Pt presented to ED via [facility] from [facility]. Pt complains of dizziness that started today and blurry vision that has been ongoing x2 days. With EMS pt postive for orthostatics. BP initally 160s lying, 130s when sitting, and 120s standing. Pt given 516m NS in route. Pt reports that this has happened before when he was dehydrated. Pt denies blood thinners.

## 2022-03-21 NOTE — ED Provider Notes (Signed)
[facility] Provider Note    Event Date/Time   First MD Initiated Contact with Patient 03/21/22 1706     (approximate)   History   Dizziness   HPI  Frederick Alvarez is a 57 y.o. male   Past medical history of pretension and daily alcohol use who presents to the emergency department with lightheadedness episode that happened twice today.  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  He continued to have mild lightheadedness and dizziness on the drive back to [place] and it worsened when he got out of the truck.  He denies chest pain, palpitations, shortness of breath or recent illnesses.  His last alcohol was last night.  He denies any motor or sensory deficits.  He never passed out.  He feels similar to when he was diagnosed with electrolyte derangements and dehydration years ago.  After IV fluids he felt better.  Currently he has minimal symptoms.   External Medical Documents Reviewed: Emergency department visit dated June 2020 for lightheadedness with negative serial troponins and discharged      Physical Exam   Triage Vital Signs: ED Triage Vitals [03/21/22 1559]  Enc Vitals Group     BP      Pulse      Resp      Temp      Temp src      SpO2      Weight 134 lb 14.7 oz (61.2 kg)     Height 5' 10"$  (1.778 m)     Head Circumference      Peak Flow      Pain Score      Pain Loc      Pain Edu?      Excl. in [place]?     Most recent vital signs: Vitals:   03/21/22 1825 03/21/22 1830  BP:  139/79  Pulse: 71 77  Resp:  16  Temp: 98.2 F (36.8 C) 98.2 F (36.8 C)  SpO2: 99% 100%    General: Awake, no distress.  CV:  Mucus membranes are slightly dry. Resp:  Normal effort.  Abd:  No distention.  Other:  Lungs clear abdomen soft and nontender.  Gait is stable.  He has some mild tremors and tongue fasciculation.  He has no focal neurologic deficits including  finger-to-nose, facial asymmetry, dysarthria, extraocular movements, motor or sensory.   ED Results / Procedures / Treatments   Labs (all labs ordered are listed, but only abnormal results are displayed) Labs Reviewed  BASIC METABOLIC PANEL - Abnormal; Notable for the following components:      Result Value   Sodium 127 (*)    Chloride 92 (*)    Glucose, Bld 116 (*)    BUN <5 (*)    Calcium 8.6 (*)    All other components within normal limits  CBC - Abnormal; Notable for the following components:   RBC 3.60 (*)    HCT 37.0 (*)    MCV 102.8 (*)    MCH 36.4 (*)    All other components within normal limits  URINALYSIS, ROUTINE W REFLEX MICROSCOPIC - Abnormal; Notable for the following components:   Color, Urine YELLOW (*)    APPearance CLEAR (*)    All other components within normal limits  ETHANOL  MAGNESIUM  VITAMIN B12  CBG MONITORING, ED  TROPONIN I (HIGH SENSITIVITY)     I ordered and  reviewed the above labs they are notable for he has hyponatremia which is chronic and unchanged at 127 his creatinine is normal at 0.6   EKG  ED ECG REPORT I, Lucillie Garfinkel, the attending physician, personally viewed and interpreted this ECG.   Date: 03/21/2022  EKG Time: 1600  Rate: 85  Rhythm: nsr  Axis: nl  Intervals: none  ST&T Change: no acute ischemic changes    PROCEDURES:  Critical Care performed: No  Procedures   MEDICATIONS ORDERED IN ED: Medications  folic acid 1 mg in sodium chloride 0.9 % 50 mL IVPB (has no administration in time range)  thiamine (VITAMIN B1) injection 100 mg (100 mg Intravenous Given 03/21/22 1840)  diazepam (VALIUM) tablet 10 mg (10 mg Oral Given 03/21/22 1840)     IMPRESSION / MDM / ASSESSMENT AND PLAN / ED COURSE  I reviewed the triage vital signs and the nursing notes.                                Patient's presentation is most consistent with acute presentation with potential threat to life or bodily function.  Differential  diagnosis includes, but is not limited to, dehydration or electrolyte derangement, ACS, dysrhythmia, CVA, alcohol withdrawal, intoxication   The patient is on the cardiac monitor to evaluate for evidence of arrhythmia and/or significant heart rate changes.  MDM: Patient with probably orthostatic lightheadedness that has nearly resolved with IV fluid and, no chest pain palpitations shortness of breath or recent illnesses.  He appears to be in very mild alcohol withdrawal at this time, will medicate with p.o. Valium while awaiting laboratory testing including serial troponins, EKG, electrolytes.  Will treat with thiamine and folate check B12 given macrocytic chronic.   Stable in the emergency department, vital signs remain normal, asymptomatic while workup is ongoing.  Signed out to oncoming provider pending remaining of labs and reassessment.  If he continues to be asymptomatic and labs are within normal limits, plan is for discharge home with PMD follow-up and return precautions.  Prescription given for thiamine and folate and counseled on alcohol use.  Given resources for alcohol detox programs.        FINAL CLINICAL IMPRESSION(S) / ED DIAGNOSES   Final diagnoses:  Dizziness     Rx / DC Orders   ED Discharge Orders          Ordered    folic acid (FOLVITE) 1 MG tablet  Daily        03/21/22 1843    thiamine (VITAMIN B1) 100 MG tablet  Daily        03/21/22 1843             Note:  This document was prepared using Dragon voice recognition software and may include unintentional dictation errors.    Lucillie Garfinkel, MD 03/21/22 [phone]

## 2022-03-21 NOTE — ED Provider Notes (Signed)
-----------------------------------------   6:55 PM on 03/21/2022 -----------------------------------------  Blood pressure 139/79, pulse 77, temperature 98.2 F (36.8 C), temperature source Oral, resp. rate 16, height 5\' 10"  (1.778 m), weight 61.2 kg, SpO2 100 %.  Assuming care from Dr. Jacelyn Grip.  In short, Frederick Alvarez is a 57 y.o. male with a chief complaint of Dizziness .  Refer to the original H&P for additional details.  The current plan of care is to await patient's labs.  Patient had what appeared to be some orthostatic driven dizziness earlier today.  Patient was given fluids by EMS and largely improved on arrival.  Handoff is awaiting patient's labs.  He is an alcoholic, does not appear that it is withdrawal symptoms but was given Valium to ensure no evidence that this was part of withdrawal.  If patient's electrolytes are reassuring, he remains largely asymptomatic he should be stable for discharge.  Otherwise would consider admission for TIA workup with hospital service.Marland Kitchen    ----------------------------------------- 8:50 PM on 03/21/2022 -----------------------------------------   Patient's labs at this time are reassuring.  Patient remains asymptomatic.  We discussed that this could be a combination of dehydration as well as some mild alcohol withdrawal.  Discussed management for same.  Patient will have prescription for thiamine and folate.  Encouraged continued intake of fluids throughout the day.  Concerning signs and symptoms and return precautions discussed with the patient.  However at this time patient appears stable for discharge.   ED diagnosis:  Dizziness   Frederick Alvarez, Jarome Lamas 03/21/22 2051    Lucillie Garfinkel, MD 03/24/22 [id number]

## 2022-04-16 ENCOUNTER — Emergency Department: Payer: Managed Care, Other (non HMO)

## 2022-04-16 ENCOUNTER — Observation Stay: Payer: Managed Care, Other (non HMO)

## 2022-04-16 ENCOUNTER — Observation Stay
Admission: EM | Admit: 2022-04-16 | Discharge: 2022-04-17 | Disposition: A | Discharge status: Home / Self Care | Payer: Managed Care, Other (non HMO) | Attending: Internal Medicine | Admitting: Internal Medicine

## 2022-04-16 ENCOUNTER — Other Ambulatory Visit: Payer: Self-pay

## 2022-04-16 DIAGNOSIS — F17210 Nicotine dependence, cigarettes, uncomplicated: Secondary | ICD-10-CM | POA: Insufficient documentation

## 2022-04-16 DIAGNOSIS — H538 Other visual disturbances: Secondary | ICD-10-CM

## 2022-04-16 DIAGNOSIS — R0602 Shortness of breath: Secondary | ICD-10-CM | POA: Insufficient documentation

## 2022-04-16 DIAGNOSIS — I10 Essential (primary) hypertension: Secondary | ICD-10-CM | POA: Insufficient documentation

## 2022-04-16 DIAGNOSIS — R410 Disorientation, unspecified: Secondary | ICD-10-CM | POA: Diagnosis not present

## 2022-04-16 DIAGNOSIS — R911 Solitary pulmonary nodule: Secondary | ICD-10-CM | POA: Insufficient documentation

## 2022-04-16 DIAGNOSIS — D7589 Other specified diseases of blood and blood-forming organs: Secondary | ICD-10-CM | POA: Diagnosis not present

## 2022-04-16 DIAGNOSIS — E871 Hypo-osmolality and hyponatremia: Secondary | ICD-10-CM | POA: Diagnosis not present

## 2022-04-16 DIAGNOSIS — R41843 Psychomotor deficit: Secondary | ICD-10-CM

## 2022-04-16 DIAGNOSIS — R4701 Aphasia: Secondary | ICD-10-CM | POA: Diagnosis not present

## 2022-04-16 DIAGNOSIS — R4182 Altered mental status, unspecified: Principal | ICD-10-CM | POA: Insufficient documentation

## 2022-04-16 DIAGNOSIS — Z79899 Other long term (current) drug therapy: Secondary | ICD-10-CM | POA: Diagnosis not present

## 2022-04-16 DIAGNOSIS — R2990 Unspecified symptoms and signs involving the nervous system: Secondary | ICD-10-CM

## 2022-04-16 LAB — COMPREHENSIVE METABOLIC PANEL WITH GFR
ALT: 21 U/L (ref 0–44)
AST: 30 U/L (ref 15–41)
Albumin: 4.1 g/dL (ref 3.5–5.0)
Alkaline Phosphatase: 79 U/L (ref 38–126)
Anion gap: 10 (ref 5–15)
BUN: 5 mg/dL — ABNORMAL LOW (ref 6–20)
CO2: 25 mmol/L (ref 22–32)
Calcium: 9 mg/dL (ref 8.9–10.3)
Chloride: 97 mmol/L — ABNORMAL LOW (ref 98–111)
Creatinine, Ser: 0.64 mg/dL (ref 0.61–1.24)
GFR, Estimated: >60 mL/min
Glucose, Bld: 116 mg/dL — ABNORMAL HIGH (ref 70–99)
Potassium: 3.6 mmol/L (ref 3.5–5.1)
Sodium: 132 mmol/L — ABNORMAL LOW (ref 135–145)
Total Bilirubin: 0.8 mg/dL (ref 0.3–1.2)
Total Protein: 7.7 g/dL (ref 6.5–8.1)

## 2022-04-16 LAB — CBC
HCT: 38.2 % — ABNORMAL LOW (ref 39.0–52.0)
HCT: 40 % (ref 39.0–52.0)
Hemoglobin: 13.2 g/dL (ref 13.0–17.0)
Hemoglobin: 13.9 g/dL (ref 13.0–17.0)
MCH: 36.5 pg — ABNORMAL HIGH (ref 26.0–34.0)
MCH: 37 pg — ABNORMAL HIGH (ref 26.0–34.0)
MCHC: 34.6 g/dL (ref 30.0–36.0)
MCHC: 34.8 g/dL (ref 30.0–36.0)
MCV: 105 fL — ABNORMAL HIGH (ref 80.0–100.0)
MCV: 107 fL — ABNORMAL HIGH (ref 80.0–100.0)
Platelets: 216 10*3/uL (ref 150–400)
Platelets: 248 K/uL (ref 150–400)
RBC: 3.57 MIL/uL — ABNORMAL LOW (ref 4.22–5.81)
RBC: 3.81 MIL/uL — ABNORMAL LOW (ref 4.22–5.81)
RDW: 13.6 % (ref 11.5–15.5)
RDW: 13.7 % (ref 11.5–15.5)
WBC: 4.1 10*3/uL (ref 4.0–10.5)
WBC: 5.1 K/uL (ref 4.0–10.5)
nRBC: 0 % (ref 0.0–0.2)
nRBC: 0 % (ref 0.0–0.2)

## 2022-04-16 LAB — DIFFERENTIAL
Abs Immature Granulocytes: 0.01 10*3/uL (ref 0.00–0.07)
Basophils Absolute: 0 10*3/uL (ref 0.0–0.1)
Basophils Relative: 1 %
Eosinophils Absolute: 0.1 10*3/uL (ref 0.0–0.5)
Eosinophils Relative: 1 %
Immature Granulocytes: 0 %
Lymphocytes Relative: 25 %
Lymphs Abs: 1.3 10*3/uL (ref 0.7–4.0)
Monocytes Absolute: 0.7 10*3/uL (ref 0.1–1.0)
Monocytes Relative: 15 %
Neutro Abs: 3 10*3/uL (ref 1.7–7.7)
Neutrophils Relative %: 58 %

## 2022-04-16 LAB — URINE DRUG SCREEN, QUALITATIVE (ARMC ONLY)
Amphetamines, Ur Screen: NOT DETECTED
Barbiturates, Ur Screen: NOT DETECTED
Benzodiazepine, Ur Scrn: NOT DETECTED
Cannabinoid 50 Ng, Ur ~~LOC~~: NOT DETECTED
Cocaine Metabolite,Ur ~~LOC~~: NOT DETECTED
MDMA (Ecstasy)Ur Screen: NOT DETECTED
Methadone Scn, Ur: NOT DETECTED
Opiate, Ur Screen: NOT DETECTED
Phencyclidine (PCP) Ur S: NOT DETECTED
Tricyclic, Ur Screen: NOT DETECTED

## 2022-04-16 LAB — PROTIME-INR
INR: 0.9 (ref 0.8–1.2)
Prothrombin Time: 12.3 s (ref 11.4–15.2)

## 2022-04-16 LAB — CBG MONITORING, ED: Glucose-Capillary: 113 mg/dL — ABNORMAL HIGH (ref 70–99)

## 2022-04-16 LAB — D-DIMER, QUANTITATIVE: D-Dimer, Quant: 1.47 ug/mL-FEU — ABNORMAL HIGH (ref 0.00–0.50)

## 2022-04-16 LAB — OSMOLALITY: Osmolality: 281 mOsm/kg (ref 275–295)

## 2022-04-16 LAB — ETHANOL: Alcohol, Ethyl (B): <10 mg/dL

## 2022-04-16 LAB — APTT: aPTT: 27 s (ref 24–36)

## 2022-04-16 MED ORDER — THIAMINE HCL 100 MG/ML IJ SOLN: 100.0000 mg | Freq: Every day | INTRAMUSCULAR | Status: DC

## 2022-04-16 MED ORDER — THIAMINE HCL 100 MG/ML IJ SOLN
500.0000 mg | Freq: Three times a day (TID) | INTRAVENOUS | Status: DC
  Administered 2022-04-16 – 2022-04-17 (×3): 500 mg via INTRAVENOUS
  Filled 2022-04-16 (×5): qty 5

## 2022-04-16 MED ORDER — GADOBUTROL 1 MMOL/ML IV SOLN
6.0000 mL | Freq: Once | INTRAVENOUS | Status: AC | PRN
  Administered 2022-04-16: 6 mL via INTRAVENOUS

## 2022-04-16 MED ORDER — SODIUM CHLORIDE 0.9 % IV BOLUS
1000.0000 mL | Freq: Once | INTRAVENOUS | Status: AC
  Administered 2022-04-16: 1000 mL via INTRAVENOUS

## 2022-04-16 MED ORDER — IOHEXOL 350 MG/ML SOLN
100.0000 mL | Freq: Once | INTRAVENOUS | Status: AC | PRN
  Administered 2022-04-16: 100 mL via INTRAVENOUS

## 2022-04-16 MED ORDER — ASPIRIN 300 MG RE SUPP: 300.0000 mg | Freq: Once | RECTAL | Status: AC

## 2022-04-16 MED ORDER — ENOXAPARIN SODIUM 40 MG/0.4ML IJ SOSY
40.0000 mg | PREFILLED_SYRINGE | INTRAMUSCULAR | Status: DC
  Administered 2022-04-17: 40 mg via SUBCUTANEOUS
  Filled 2022-04-16: qty 0.4

## 2022-04-16 MED ORDER — ASPIRIN 325 MG PO TABS
325.0000 mg | ORAL_TABLET | Freq: Once | ORAL | Status: AC
  Administered 2022-04-16: 325 mg via ORAL
  Filled 2022-04-16: qty 1

## 2022-04-16 MED ORDER — STROKE: EARLY STAGES OF RECOVERY BOOK: Freq: Once | Status: DC

## 2022-04-16 MED ORDER — THIAMINE HCL 100 MG/ML IJ SOLN: 250.0000 mg | Freq: Every day | INTRAVENOUS | Status: DC

## 2022-04-16 MED ORDER — SODIUM CHLORIDE 0.9 % IV SOLN: INTRAVENOUS | Status: DC

## 2022-04-16 MED ORDER — LOSARTAN POTASSIUM 50 MG PO TABS
50.0000 mg | ORAL_TABLET | Freq: Every day | ORAL | Status: DC
  Administered 2022-04-17: 50 mg via ORAL
  Filled 2022-04-16: qty 1

## 2022-04-16 MED ORDER — NICOTINE POLACRILEX 2 MG MT GUM: 2.0000 mg | CHEWING_GUM | OROMUCOSAL | Status: DC | PRN

## 2022-04-16 NOTE — Progress Notes (Signed)
Neurologist, Dr Rosalin Hawking, joins the telemedicine cart at 1704.

## 2022-04-16 NOTE — Progress Notes (Signed)
Patient back from CT imaging

## 2022-04-16 NOTE — Consult Note (Signed)
[facility] Telemedicine Consult   Requesting Provider: Dr. Ellender Hose  Chief Complaint: AMS  HPI:  57 yo M with Hx of HTN, smoker, alcohol, sinusitis presented to ED for code stroke. Mom at bedside stated that pt lives with mom, he went to work at [employer] at 7:30am, at that time he was fine. Per chart, he was at work doing well at 9:00am. 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 And he came back at 1:30pm and was not himself, felt confused and disorientated, left arm tingling. And he was found to be slow responding, slow answer questions. Per pt he drove back from [place], felt blurry vision and confused, not sure what exact time onset. He told his supervision about 2pm and he was sent to ER for evaluation. Mom met him in ED and he did not recognize his mom. Glucose 113, BP 155/92. CT head no acute finding, CTA head and neck and CTP unremarkable except b/l carotid bulb athero but no stenosis.   He has been in ER for dizziness several time in the last several years. Last one was on 03/21/22 and was told to be dehydrated and possible mild alcohol withdraw. Per mom, he has hx of HTN on BP meds. He is current smoker and drinks 1-2 beers per day. Denies illicit drugs.  LKW: 9am tpa given?: No, outside window, NIHSS = 0 IR Thrombectomy? No, no LVO Modified Rankin Scale: 0-Completely asymptomatic and back to baseline post- stroke   Exam: Vitals:   04/16/22 1459 04/16/22 1700  BP: (!) 167/89 (!) 162/86  Pulse: 86 82  Resp: 16 17  Temp: 98 F (36.7 C)   SpO2: 98% 100%     Temp:  [98 F (36.7 C)] 98 F (36.7 C) (03/06 1459) Pulse Rate:  [82-86] 82 (03/06 1700) Resp:  [16-17] 17 (03/06 1700) BP: (162-167)/(86-89) 162/86 (03/06 1700) SpO2:  [98 %-100 %] 100 % (03/06 1700) Weight:  [61.2 kg] 61.2 kg (03/06 1502)  General - Well nourished, well developed, in no apparent distress, slight psychomotor slowing.  Ophthalmologic - fundi not visualized due to  noncooperation.  Cardiovascular - Regular rhythm and rate.  Neuro - awake, alert, eyes open, orientated to age, place, time. No aphasia, paucity of speech, following all simple commands. Able to name and repeat and read. No gaze palsy, tracking bilaterally, visual field full. No facial droop. Tongue midline. Bilateral UEs 5/5, no drift. Bilaterally LEs 5/5, no drift. Sensation symmetrical bilaterally, b/l FTN intact, gait not tested.  Marland Kitchen   NIH Stroke Scale = 0   Imaging Reviewed:  CT ANGIO HEAD NECK W WO CM W PERF (CODE STROKE)  Result Date: 04/16/2022 CLINICAL DATA:  Neuro deficit, acute, stroke suspected EXAM: CT ANGIOGRAPHY HEAD AND NECK CT PERFUSION BRAIN TECHNIQUE: Multidetector CT imaging of the head and neck was performed using the standard protocol during bolus administration of intravenous contrast. Multiplanar CT image reconstructions and MIPs were obtained to evaluate the vascular anatomy. Carotid stenosis measurements (when applicable) are obtained utilizing NASCET criteria, using the distal internal carotid diameter as the denominator. Multiphase CT imaging of the brain was performed following IV bolus contrast injection. Subsequent parametric perfusion maps were calculated using RAPID software. RADIATION DOSE REDUCTION: This exam was performed according to the departmental dose-optimization program which includes automated exposure control, adjustment of the mA and/or kV according to patient size and/or use of iterative reconstruction technique. CONTRAST:  1108m OMNIPAQUE IOHEXOL 350 MG/ML SOLN COMPARISON:  Same day CT head. FINDINGS:  CTA NECK FINDINGS Aortic arch: Great vessel origins are patent without significant stenosis. Atherosclerosis. Right carotid system: Atherosclerosis at the carotid bifurcation without greater than 50% stenosis. Left carotid system: Atherosclerosis at the carotid bifurcation without greater than 50% stenosis. Vertebral arteries: Codominant. No evidence of  dissection, stenosis (50% or greater), or occlusion. Skeleton: No acute fracture. Other neck: No acute findings. Upper chest: Biapical pleuroparenchymal scarring. Emphysema. Review of the MIP images confirms the above findings CTA HEAD FINDINGS Anterior circulation: Bilateral intracranial ICAs, MCAs, and ACAs patent without proximal hemodynamically significant stenosis. Posterior circulation: Bilateral intradural vertebral arteries basilar artery and bilateral posterior cerebral arteries are patent without proximal hemodynamically significant stenosis. Venous sinuses: As permitted by contrast timing, patent. CT Brain Perfusion Findings: ASPECTS: 10. CBF (<30%) Volume: 23m Perfusion (Tmax>6.0s) volume: 061mMismatch Volume: 25m64mnfarction Location:None identified. IMPRESSION: 1. No large vessel occlusion or proximal hemodynamically significant stenosis. 2. No evidence of core infarct or penumbra on CT perfusion. 3. Bilateral carotid bifurcation atherosclerosis without greater than 50% stenosis. 4. Approximately 6 mm pulmonary nodule at the left lung apex. Non-contrast chest CT at 6-12 months is recommended. If the nodule is stable at time of repeat CT, then future CT at 18-24 months (from today's scan) is considered optional for low-risk patients, but is recommended for high-risk patients. This recommendation follows the consensus statement: Guidelines for Management of Incidental Pulmonary Nodules Detected on CT Images: From the Fleischner Society 2017; Radiology 2017; 284:228-243. 5. Aortic Atherosclerosis (ICD10-I70.0) and Emphysema (ICD10-J43.9). Electronically Signed   By: FreMargaretha SheffieldD.   On: 04/16/2022 17:44   CT HEAD WO CONTRAST  Result Date: 04/16/2022 CLINICAL DATA:  Mental status change, unknown cause EXAM: CT HEAD WITHOUT CONTRAST TECHNIQUE: Contiguous axial images were obtained from the base of the skull through the vertex without intravenous contrast. RADIATION DOSE REDUCTION: This exam was  performed according to the departmental dose-optimization program which includes automated exposure control, adjustment of the mA and/or kV according to patient size and/or use of iterative reconstruction technique. COMPARISON:  11/11/2012 FINDINGS: Brain: No evidence of acute infarction, hemorrhage, hydrocephalus, extra-axial collection or mass lesion/mass effect. Vascular: No hyperdense vessel or unexpected calcification. Skull: Normal. Negative for fracture or focal lesion. Sinuses/Orbits: No acute finding. Other: None. IMPRESSION: No acute intracranial abnormality. Electronically Signed   By: NicDavina PokeO.   On: 04/16/2022 15:38     Labs reviewed in epic and pertinent values follow: Na 132, Cre 0.64, MCV and MCH elevated  Assessment:  [age] M with Hx of HTN, smoker, alcohol abuse, sinusitis presented to ED for blurry vision, confusion and psychomotor slowing.  LSW 9am at work. [facility] [facility], and he felt improved. CT head no acute finding, CTA head and neck and CTP unremarkable except b/l carotid bulb athero but no stenosis. He has been in ER for dizziness several time in the last several years, was told due to dehydration and alcohol withdraw. His Cre was within normal limits. Pt not TNK candidate due to outside window and NIHSS =0. No IR as no LVO and CTP neg. Etiology for his symptoms not quite sure, could be seizure vs. Encephalopathy vs small infarct. Will do MRI with and without, EEG, echo and lab work.    Recommendations:  Recommend overnight observation to continue further work up  Frequent neuro checks Telemetry monitoring MRI brain with and without EEG routine Echocardiogram  UDS, fasting lipid panel and HgbA1C, TSH, B12, ammonia and  B1 PT/OT/speech consult Permissive hypertension (only treat if BP > 220/120 unless a lower blood pressure is clinically necessary) for 24-48 hours post stroke onset GI and DVT prophylaxis  ASA  325 stat. Consider ASA 300 PR if not passing swallow screen Stroke risk factor modification Further evaluation as per inpatient neurology recommendations  Discussed with Dr. Ellender Hose ED physician We will follow   Consult Participants: mom, pt, me, RN and stroke response RN Location of the provider: [facility] Location of the patient: [place]  Time Code Stroke Page received:  5:04pm Time neurologist arrived:  5:05pm Time NIHSS completed: 5:31pm (pt went to CT first per protocol)    This consult was provided via telemedicine with 2-way video and audio communication. The patient/family was informed that care would be provided in this way and agreed to receive care in this manner.   This patient is receiving care for possible acute neurological changes. There was 60 minutes of care by this provider at the time of service, including time for direct evaluation via telemedicine, review of medical records, imaging studies and discussion of findings with providers, the patient and/or family.  Rosalin Hawking, MD PhD Stroke Neurology 04/16/2022 5:15 PM

## 2022-04-16 NOTE — ED Notes (Signed)
CODE  STROKE  CALLED  TO  CARELINK  

## 2022-04-16 NOTE — ED Provider Notes (Signed)
[facility] Provider Note    Event Date/Time   First MD Initiated Contact with Patient 04/16/22 1642     (approximate)   History   Altered Mental Status   HPI  Frederick Alvarez is a 57 y.o. male  here with ams. History initially provided by family and ems. Per report, pt was in his usual state of health when he left his house this am at around 7:30. Per report from EMS, coworker said that around 9:30 pt began to have difficulty speaking and weakness. He subsequently presents for evaluation. Currently he can say one work at a time but cannot express himself. He shakes his head no to any pain and is able to nod when mother providing history. He isn't sure when he last felt normal. Activated as a CODE STROKE.       Physical Exam   Triage Vital Signs: ED Triage Vitals  Enc Vitals Group     BP 04/16/22 1459 (!) 167/89     Pulse Rate 04/16/22 1459 86     Resp 04/16/22 1459 16     Temp 04/16/22 1459 98 F (36.7 C)     Temp Source 04/16/22 1459 Axillary     SpO2 04/16/22 1459 98 %     Weight 04/16/22 1502 134 lb 14.7 oz (61.2 kg)     Height 04/16/22 1502 5\' 10"  (1.778 m)     Head Circumference --      Peak Flow --      Pain Score 04/16/22 1501 0     Pain Loc --      Pain Edu? --      Excl. in GC? --     Most recent vital signs: Vitals:   04/17/22 0100 04/17/22 0200  BP: 128/77 127/73  Pulse: 67 60  Resp: (!) 21 14  Temp:    SpO2: 97% 98%     General: Awake, no distress.  CV:  Good peripheral perfusion.  Resp:  Normal work of breathing.  Abd:  No distention.  Other:  Speech aphasic with one word expression. Follows commands. Face is symmetric. Tongue protrusion is midline. Strength 4/5 LUE and LLE, 5/5 RUE and RLE. Normal sensation to light touch. Gait deferred.   ED Results / Procedures / Treatments   Labs (all labs ordered are listed, but only abnormal results are displayed) Labs Reviewed  CBC - Abnormal; Notable for the following  components:      Result Value   RBC 3.81 (*)    MCV 105.0 (*)    MCH 36.5 (*)    All other components within normal limits  COMPREHENSIVE METABOLIC PANEL - Abnormal; Notable for the following components:   Sodium 132 (*)    Chloride 97 (*)    Glucose, Bld 116 (*)    BUN 5 (*)    All other components within normal limits  D-DIMER, QUANTITATIVE - Abnormal; Notable for the following components:   D-Dimer, Quant 1.47 (*)    All other components within normal limits  CBC - Abnormal; Notable for the following components:   RBC 3.57 (*)    HCT 38.2 (*)    MCV 107.0 (*)    MCH 37.0 (*)    All other components within normal limits  CREATININE, SERUM - Abnormal; Notable for the following components:   Creatinine, Ser 0.55 (*)    All other components within normal limits  CBG MONITORING, ED - Abnormal; Notable for the following components:  Glucose-Capillary 113 (*)    All other components within normal limits  PROTIME-INR  APTT  DIFFERENTIAL  ETHANOL  URINE DRUG SCREEN, QUALITATIVE ([facility] ONLY)  OSMOLALITY  VITAMIN B12  VITAMIN B1  FOLATE  TSH  AMMONIA  HEMOGLOBIN A1C  LIPID PANEL  THYROID PANEL WITH TSH  CORTISOL-AM, BLOOD  HIV ANTIBODY (ROUTINE TESTING W REFLEX)  CBG MONITORING, ED     EKG Normal sinus rhythm, VR 91. PR 136, QRS 88, QTc 447. No acute st elevations or depressions. No ischemia or infarct.   RADIOLOGY CT Head; NAICA   I also independently reviewed and agree with radiologist interpretations.   PROCEDURES:  Critical Care performed: No  .1-3 Lead EKG Interpretation  Performed by: Shaune Pollack, MD Authorized by: Shaune Pollack, MD     Interpretation: normal     ECG rate:  70-90   ECG rate assessment: normal     Rhythm: sinus rhythm     Ectopy: none     Conduction: normal   Comments:     Indication: Stroke-like symptoms     MEDICATIONS ORDERED IN ED: Medications  sodium chloride 0.9 % bolus 1,000 mL (0 mLs Intravenous Stopped 04/16/22  2038)    Followed by  0.9 %  sodium chloride infusion ( Intravenous Started During Downtime 04/16/22 2335)  thiamine (VITAMIN B1) 500 mg in sodium chloride 0.9 % 50 mL IVPB (0 mg Intravenous Stopped 04/16/22 2219)    Followed by  thiamine (VITAMIN B1) 250 mg in sodium chloride 0.9 % 50 mL IVPB (has no administration in time range)    Followed by  thiamine (VITAMIN B1) injection 100 mg (has no administration in time range)   stroke: early stages of recovery book (has no administration in time range)  enoxaparin (LOVENOX) injection 40 mg (has no administration in time range)  nicotine polacrilex (NICORETTE) gum 2 mg (has no administration in time range)  losartan (COZAAR) tablet 50 mg (has no administration in time range)  iohexol (OMNIPAQUE) 350 MG/ML injection 100 mL (100 mLs Intravenous Contrast Given 04/16/22 1725)  aspirin tablet 325 mg (325 mg Oral Given 04/16/22 1755)    Or  aspirin suppository 300 mg ( Rectal See Alternative 04/16/22 1755)  gadobutrol (GADAVIST) 1 MMOL/ML injection 6 mL (6 mLs Intravenous Contrast Given 04/16/22 2057)     IMPRESSION / MDM / ASSESSMENT AND PLAN / ED COURSE  I reviewed the triage vital signs and the nursing notes.                              Differential diagnosis includes, but is not limited to, CVA, TIA, ICH, mass/lesion, partial seizure  Patient's presentation is most consistent with acute presentation with potential threat to life or bodily function.  The patient is on the cardiac monitor to evaluate for evidence of arrhythmia and/or significant heart rate changes  57 yo M here with transient weakness and expressive aphasia, now resolved. On arrival pt had L sided weakness and expressive aphasia so he was activated as possible LVO, though sx now resolved and suspect more so possible TIA. Neuro consulted and recommends admission for obs and work-up. Pt in agremeent. CBC, CMP unremarkable. CT head, Angio reviewed and are negative for acute abnormality on my  read.   FINAL CLINICAL IMPRESSION(S) / ED DIAGNOSES   Final diagnoses:  Stroke-like symptoms     Rx / DC Orders   ED Discharge Orders  None        Note:  This document was prepared using Dragon voice recognition software and may include unintentional dictation errors.   Shaune Pollack, MD 04/17/22 [phone]

## 2022-04-16 NOTE — Assessment & Plan Note (Signed)
Mild, check serum osmolality, cortisol and thyroid cascade

## 2022-04-16 NOTE — Progress Notes (Signed)
Stroke alert cart activation at 1703

## 2022-04-16 NOTE — Progress Notes (Signed)
Patient sent to CT department for advanced imaging.

## 2022-04-16 NOTE — Assessment & Plan Note (Signed)
Repeat imaging in 4 to 6 weeks

## 2022-04-16 NOTE — Assessment & Plan Note (Signed)
Restart losartan

## 2022-04-16 NOTE — Assessment & Plan Note (Signed)
Follow-up B12 and folate levels

## 2022-04-16 NOTE — Assessment & Plan Note (Signed)
Resolved, no hypoxia, check D-dimer and a chest x-ray

## 2022-04-16 NOTE — H&P (Signed)
History and Physical    Patient: Frederick Alvarez [id number] DOB: 1966-01-14 DOA: 04/16/2022 DOS: the patient was seen and examined on 04/16/2022 PCP: Maury Dus, MD (Inactive)  Patient coming from: Home  Chief Complaint:  Chief Complaint  Patient presents with   Altered Mental Status   HPI: Frederick Alvarez is a 57 y.o. 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  At the time he reports new onset of sensation of shortness of breath associated with trouble walking.  Patient initially denied to me that he had any trouble speaking but then he states that when he gave came to the ER he could not speak.  Therefore patient's history is a little unreliable at this time.  From report from the ER as well as from EMS and coworkers at around 930 patient started having trouble speaking and weakness on the left side and therefore was brought to [facility] ER.  All of patient's neurologic symptoms have since resolved the patient has no current weakness and has been speaking fluently since.  Patient has no headache no vision changes no neck pain no trauma no loss of consciousness no tremor no presyncope. Review of Systems: As mentioned in the history of present illness. All other systems reviewed and are negative. But may not be reliable. Past Medical History:  Diagnosis Date   Hypertension    History reviewed. No pertinent surgical history. Social History:  reports that he has been smoking cigarettes. He has never used smokeless tobacco. He reports current alcohol use. He reports that he does not use drugs. Patient was advised to quit smoking by me No Known Allergies  History reviewed. No pertinent family history.  Prior to Admission medications   Medication Sig Start Date End Date Taking? Authorizing Provider  amLODipine (NORVASC) 5 MG tablet Take 5 mg by mouth daily.    [provider]   amoxicillin (AMOXIL) 500 MG capsule Take 1 capsule (500 mg total) by mouth 3 (three) times daily. 10/19/18   Blanchie Dessert, MD  azithromycin (ZITHROMAX) 250 MG tablet Take 1 tablet (250 mg total) by mouth daily. Take first 2 tablets together, then 1 every day until finished. 09/02/13   Gregor Hams, MD  chlordiazePOXIDE (LIBRIUM) 25 MG capsule '50mg'$  PO TID x 1D, then '50mg'$  PO BID X 1D, then '50mg'$  PO QD X 1D 08/20/17   Glyn Ade, PA-C  folic acid (FOLVITE) 1 MG tablet Take 1 tablet (1 mg total) by mouth daily. 03/21/22 03/21/23  Cuthriell, Charline Bills, PA-C  hydrochlorothiazide (HYDRODIURIL) 12.5 MG tablet Take 12.5 mg by mouth daily.    [provider]  ipratropium (ATROVENT) 0.06 % nasal spray Place 2 sprays into both nostrils 4 (four) times daily. 09/02/13   Gregor Hams, MD  losartan-hydrochlorothiazide (HYZAAR) 100-12.5 MG per tablet Take 1 tablet by mouth daily.    [provider]  olmesartan (BENICAR) 40 MG tablet Take 40 mg by mouth daily.    [provider]  predniSONE (DELTASONE) 10 MG tablet Take 3 tablets (30 mg total) by mouth daily. 09/02/13   Gregor Hams, MD  thiamine (VITAMIN B1) 100 MG tablet Take 1 tablet (100 mg total) by mouth daily. 03/21/22 06/19/22  Darletta Moll, PA-C    Physical Exam: Vitals:   04/16/22 1502 04/16/22 1700 04/16/22 1902 04/16/22 2000  BP:  (!) 162/86  Marland Kitchen)  140/82  Pulse:  82  84  Resp:  17  14  Temp:   98.6 F (37 C)   TempSrc:   Oral   SpO2:  100%  98%  Weight: 61.2 kg     Height: '5\' 10"'$  (1.778 m)      General: Patient is alert awake and giving  history does not appear to be in any distress.  Oriented to place and person and time of day Respiratory exam: Bilateral air entry vesicular Cardiovascular exam S1-S2 normal Abdomen soft nontender Extremities warm without edema  Data Reviewed:  Labs on Admission:  Results for orders placed or performed during the hospital encounter of 04/16/22 (from the past 24  hour(s))  CBG monitoring, ED     Status: Abnormal   Collection Time: 04/16/22  2:55 PM  Result Value Ref Range   Glucose-Capillary 113 (H) 70 - 99 mg/dL  Protime-INR     Status: None   Collection Time: 04/16/22  3:06 PM  Result Value Ref Range   Prothrombin Time 12.3 11.4 - 15.2 seconds   INR 0.9 0.8 - 1.2  APTT     Status: None   Collection Time: 04/16/22  3:06 PM  Result Value Ref Range   aPTT 27 24 - 36 seconds  CBC     Status: Abnormal   Collection Time: 04/16/22  3:06 PM  Result Value Ref Range   WBC 5.1 4.0 - 10.5 K/uL   RBC 3.81 (L) 4.22 - 5.81 MIL/uL   Hemoglobin 13.9 13.0 - 17.0 g/dL   HCT 40.0 39.0 - 52.0 %   MCV 105.0 (H) 80.0 - 100.0 fL   MCH 36.5 (H) 26.0 - 34.0 pg   MCHC 34.8 30.0 - 36.0 g/dL   RDW 13.6 11.5 - 15.5 %   Platelets 248 150 - 400 K/uL   nRBC 0.0 0.0 - 0.2 %  Differential     Status: None   Collection Time: 04/16/22  3:06 PM  Result Value Ref Range   Neutrophils Relative % 58 %   Neutro Abs 3.0 1.7 - 7.7 K/uL   Lymphocytes Relative 25 %   Lymphs Abs 1.3 0.7 - 4.0 K/uL   Monocytes Relative 15 %   Monocytes Absolute 0.7 0.1 - 1.0 K/uL   Eosinophils Relative 1 %   Eosinophils Absolute 0.1 0.0 - 0.5 K/uL   Basophils Relative 1 %   Basophils Absolute 0.0 0.0 - 0.1 K/uL   Immature Granulocytes 0 %   Abs Immature Granulocytes 0.01 0.00 - 0.07 K/uL  Comprehensive metabolic panel     Status: Abnormal   Collection Time: 04/16/22  3:06 PM  Result Value Ref Range   Sodium 132 (L) 135 - 145 mmol/L   Potassium 3.6 3.5 - 5.1 mmol/L   Chloride 97 (L) 98 - 111 mmol/L   CO2 25 22 - 32 mmol/L   Glucose, Bld 116 (H) 70 - 99 mg/dL   BUN 5 (L) 6 - 20 mg/dL   Creatinine, Ser 0.64 0.61 - 1.24 mg/dL   Calcium 9.0 8.9 - 10.3 mg/dL   Total Protein 7.7 6.5 - 8.1 g/dL   Albumin 4.1 3.5 - 5.0 g/dL   AST 30 15 - 41 U/L   ALT 21 0 - 44 U/L   Alkaline Phosphatase 79 38 - 126 U/L   Total Bilirubin 0.8 0.3 - 1.2 mg/dL   GFR, Estimated >60 >60 mL/min   Anion gap 10 5  - 15  Ethanol  Status: None   Collection Time: 04/16/22  3:06 PM  Result Value Ref Range   Alcohol, Ethyl (B) <10 <10 mg/dL  Urine Drug Screen, Qualitative     Status: None   Collection Time: 04/16/22  5:00 PM  Result Value Ref Range   Tricyclic, Ur Screen NONE DETECTED NONE DETECTED   Amphetamines, Ur Screen NONE DETECTED NONE DETECTED   MDMA (Ecstasy)Ur Screen NONE DETECTED NONE DETECTED   Cocaine Metabolite,Ur [place] NONE DETECTED NONE DETECTED   Opiate, Ur Screen NONE DETECTED NONE DETECTED   Phencyclidine (PCP) Ur S NONE DETECTED NONE DETECTED   Cannabinoid 50 Ng, Ur [place] NONE DETECTED NONE DETECTED   Barbiturates, Ur Screen NONE DETECTED NONE DETECTED   Benzodiazepine, Ur Scrn NONE DETECTED NONE DETECTED   Methadone Scn, Ur NONE DETECTED NONE DETECTED      Radiological Exams on Admission:  MR BRAIN W WO CONTRAST  Result Date: 04/16/2022 CLINICAL DATA:  Altered mental status EXAM: MRI HEAD WITHOUT AND WITH CONTRAST TECHNIQUE: Multiplanar, multiecho pulse sequences of the brain and surrounding structures were obtained without and with intravenous contrast. CONTRAST:  73m GADAVIST GADOBUTROL 1 MMOL/ML IV SOLN COMPARISON:  No prior MRI available, correlation is made with CT head 04/16/2022 FINDINGS: Brain: No restricted diffusion to suggest acute or subacute infarct. No abnormal parenchymal or meningeal enhancement. No acute hemorrhage, mass, mass effect, or midline shift. No hydrocephalus or extra-axial collection. Normal pituitary and craniocervical junction. No hemosiderin deposition to suggest remote hemorrhage. Scattered T2 hyperintense signal in the periventricular white matter, likely the sequela of minimal chronic small vessel ischemic disease. Vascular: Normal arterial flow voids. Normal arterial and venous enhancement. Skull and upper cervical spine: Normal marrow signal. Sinuses/Orbits: Small air-fluid level in the left maxillary sinus. Otherwise clear paranasal sinuses. No acute  finding in the orbits. Other: Fluid in the right mastoid air cells. IMPRESSION: 1. No acute intracranial process. 2. Small air-fluid level in the left maxillary sinus, which is nonspecific but can be seen in the setting of acute sinusitis. Electronically Signed   By: AMerilyn BabaM.D.   On: 04/16/2022 21:17   CT ANGIO HEAD NECK W WO CM W PERF (CODE STROKE)  Result Date: 04/16/2022 CLINICAL DATA:  Neuro deficit, acute, stroke suspected EXAM: CT ANGIOGRAPHY HEAD AND NECK CT PERFUSION BRAIN TECHNIQUE: Multidetector CT imaging of the head and neck was performed using the standard protocol during bolus administration of intravenous contrast. Multiplanar CT image reconstructions and MIPs were obtained to evaluate the vascular anatomy. Carotid stenosis measurements (when applicable) are obtained utilizing NASCET criteria, using the distal internal carotid diameter as the denominator. Multiphase CT imaging of the brain was performed following IV bolus contrast injection. Subsequent parametric perfusion maps were calculated using RAPID software. RADIATION DOSE REDUCTION: This exam was performed according to the departmental dose-optimization program which includes automated exposure control, adjustment of the mA and/or kV according to patient size and/or use of iterative reconstruction technique. CONTRAST:  1017mOMNIPAQUE IOHEXOL 350 MG/ML SOLN COMPARISON:  Same day CT head. FINDINGS: CTA NECK FINDINGS Aortic arch: Great vessel origins are patent without significant stenosis. Atherosclerosis. Right carotid system: Atherosclerosis at the carotid bifurcation without greater than 50% stenosis. Left carotid system: Atherosclerosis at the carotid bifurcation without greater than 50% stenosis. Vertebral arteries: Codominant. No evidence of dissection, stenosis (50% or greater), or occlusion. Skeleton: No acute fracture. Other neck: No acute findings. Upper chest: Biapical pleuroparenchymal scarring. Emphysema. Review of the  MIP images confirms the above findings CTA HEAD  FINDINGS Anterior circulation: Bilateral intracranial ICAs, MCAs, and ACAs patent without proximal hemodynamically significant stenosis. Posterior circulation: Bilateral intradural vertebral arteries basilar artery and bilateral posterior cerebral arteries are patent without proximal hemodynamically significant stenosis. Venous sinuses: As permitted by contrast timing, patent. CT Brain Perfusion Findings: ASPECTS: 10. CBF (<30%) Volume: 34m Perfusion (Tmax>6.0s) volume: 022mMismatch Volume: 75m475mnfarction Location:None identified. IMPRESSION: 1. No large vessel occlusion or proximal hemodynamically significant stenosis. 2. No evidence of core infarct or penumbra on CT perfusion. 3. Bilateral carotid bifurcation atherosclerosis without greater than 50% stenosis. 4. Approximately 6 mm pulmonary nodule at the left lung apex. Non-contrast chest CT at 6-12 months is recommended. If the nodule is stable at time of repeat CT, then future CT at 18-24 months (from today's scan) is considered optional for low-risk patients, but is recommended for high-risk patients. This recommendation follows the consensus statement: Guidelines for Management of Incidental Pulmonary Nodules Detected on CT Images: From the Fleischner Society 2017; Radiology 2017; 284:228-243. 5. Aortic Atherosclerosis (ICD10-I70.0) and Emphysema (ICD10-J43.9). Electronically Signed   By: FreMargaretha SheffieldD.   On: 04/16/2022 17:44   CT HEAD WO CONTRAST  Result Date: 04/16/2022 CLINICAL DATA:  Mental status change, unknown cause EXAM: CT HEAD WITHOUT CONTRAST TECHNIQUE: Contiguous axial images were obtained from the base of the skull through the vertex without intravenous contrast. RADIATION DOSE REDUCTION: This exam was performed according to the departmental dose-optimization program which includes automated exposure control, adjustment of the mA and/or kV according to patient size and/or use of iterative  reconstruction technique. COMPARISON:  11/11/2012 FINDINGS: Brain: No evidence of acute infarction, hemorrhage, hydrocephalus, extra-axial collection or mass lesion/mass effect. Vascular: No hyperdense vessel or unexpected calcification. Skull: Normal. Negative for fracture or focal lesion. Sinuses/Orbits: No acute finding. Other: None. IMPRESSION: No acute intracranial abnormality. Electronically Signed   By: NicDavina PokeO.   On: 04/16/2022 15:38    EKG: Independently reviewed. NSR    MR BRAIN W WO CONTRAST  Result Date: 04/16/2022 CLINICAL DATA:  Altered mental status EXAM: MRI HEAD WITHOUT AND WITH CONTRAST TECHNIQUE: Multiplanar, multiecho pulse sequences of the brain and surrounding structures were obtained without and with intravenous contrast. CONTRAST:  6mL41mDAVIST GADOBUTROL 1 MMOL/ML IV SOLN COMPARISON:  No prior MRI available, correlation is made with CT head 04/16/2022 FINDINGS: Brain: No restricted diffusion to suggest acute or subacute infarct. No abnormal parenchymal or meningeal enhancement. No acute hemorrhage, mass, mass effect, or midline shift. No hydrocephalus or extra-axial collection. Normal pituitary and craniocervical junction. No hemosiderin deposition to suggest remote hemorrhage. Scattered T2 hyperintense signal in the periventricular white matter, likely the sequela of minimal chronic small vessel ischemic disease. Vascular: Normal arterial flow voids. Normal arterial and venous enhancement. Skull and upper cervical spine: Normal marrow signal. Sinuses/Orbits: Small air-fluid level in the left maxillary sinus. Otherwise clear paranasal sinuses. No acute finding in the orbits. Other: Fluid in the right mastoid air cells. IMPRESSION: 1. No acute intracranial process. 2. Small air-fluid level in the left maxillary sinus, which is nonspecific but can be seen in the setting of acute sinusitis. Electronically Signed   By: AlisMerilyn Baba.   On: 04/16/2022 21:17   CT ANGIO  HEAD NECK W WO CM W PERF (CODE STROKE)  Result Date: 04/16/2022 CLINICAL DATA:  Neuro deficit, acute, stroke suspected EXAM: CT ANGIOGRAPHY HEAD AND NECK CT PERFUSION BRAIN TECHNIQUE: Multidetector CT imaging of the head and neck was performed using the standard protocol during bolus administration of  intravenous contrast. Multiplanar CT image reconstructions and MIPs were obtained to evaluate the vascular anatomy. Carotid stenosis measurements (when applicable) are obtained utilizing NASCET criteria, using the distal internal carotid diameter as the denominator. Multiphase CT imaging of the brain was performed following IV bolus contrast injection. Subsequent parametric perfusion maps were calculated using RAPID software. RADIATION DOSE REDUCTION: This exam was performed according to the departmental dose-optimization program which includes automated exposure control, adjustment of the mA and/or kV according to patient size and/or use of iterative reconstruction technique. CONTRAST:  143m OMNIPAQUE IOHEXOL 350 MG/ML SOLN COMPARISON:  Same day CT head. FINDINGS: CTA NECK FINDINGS Aortic arch: Great vessel origins are patent without significant stenosis. Atherosclerosis. Right carotid system: Atherosclerosis at the carotid bifurcation without greater than 50% stenosis. Left carotid system: Atherosclerosis at the carotid bifurcation without greater than 50% stenosis. Vertebral arteries: Codominant. No evidence of dissection, stenosis (50% or greater), or occlusion. Skeleton: No acute fracture. Other neck: No acute findings. Upper chest: Biapical pleuroparenchymal scarring. Emphysema. Review of the MIP images confirms the above findings CTA HEAD FINDINGS Anterior circulation: Bilateral intracranial ICAs, MCAs, and ACAs patent without proximal hemodynamically significant stenosis. Posterior circulation: Bilateral intradural vertebral arteries basilar artery and bilateral posterior cerebral arteries are patent without  proximal hemodynamically significant stenosis. Venous sinuses: As permitted by contrast timing, patent. CT Brain Perfusion Findings: ASPECTS: 10. CBF (<30%) Volume: 033mPerfusion (Tmax>6.0s) volume: 8m96mismatch Volume: 8mL49mfarction Location:None identified. IMPRESSION: 1. No large vessel occlusion or proximal hemodynamically significant stenosis. 2. No evidence of core infarct or penumbra on CT perfusion. 3. Bilateral carotid bifurcation atherosclerosis without greater than 50% stenosis. 4. Approximately 6 mm pulmonary nodule at the left lung apex. Non-contrast chest CT at 6-12 months is recommended. If the nodule is stable at time of repeat CT, then future CT at 18-24 months (from today's scan) is considered optional for low-risk patients, but is recommended for high-risk patients. This recommendation follows the consensus statement: Guidelines for Management of Incidental Pulmonary Nodules Detected on CT Images: From the Fleischner Society 2017; Radiology 2017; 284:228-243. 5. Aortic Atherosclerosis (ICD10-I70.0) and Emphysema (ICD10-J43.9). Electronically Signed   By: FredMargaretha Sheffield.   On: 04/16/2022 17:44   CT HEAD WO CONTRAST  Result Date: 04/16/2022 CLINICAL DATA:  Mental status change, unknown cause EXAM: CT HEAD WITHOUT CONTRAST TECHNIQUE: Contiguous axial images were obtained from the base of the skull through the vertex without intravenous contrast. RADIATION DOSE REDUCTION: This exam was performed according to the departmental dose-optimization program which includes automated exposure control, adjustment of the mA and/or kV according to patient size and/or use of iterative reconstruction technique. COMPARISON:  11/11/2012 FINDINGS: Brain: No evidence of acute infarction, hemorrhage, hydrocephalus, extra-axial collection or mass lesion/mass effect. Vascular: No hyperdense vessel or unexpected calcification. Skull: Normal. Negative for fracture or focal lesion. Sinuses/Orbits: No acute  finding. Other: None. IMPRESSION: No acute intracranial abnormality. Electronically Signed   By: NichDavina Poke.   On: 04/16/2022 15:38    Results for orders placed or performed during the hospital encounter of 04/16/22 (from the past 48 hour(s))  CBG monitoring, ED     Status: Abnormal   Collection Time: 04/16/22  2:55 PM  Result Value Ref Range   Glucose-Capillary 113 (H) 70 - 99 mg/dL    Comment: Glucose reference range applies only to samples taken after fasting for at least 8 hours.  Protime-INR     Status: None   Collection Time: 04/16/22  3:06 PM  Result Value  Ref Range   Prothrombin Time 12.3 11.4 - 15.2 seconds   INR 0.9 0.8 - 1.2    Comment: (NOTE) INR goal varies based on device and disease states. Performed at [facility], [address] [place], [place] [address]   APTT     Status: None   Collection Time: 04/16/22  3:06 PM  Result Value Ref Range   aPTT 27 24 - 36 seconds    Comment: Performed at [facility], [address] [place], [place] [address]  CBC     Status: Abnormal   Collection Time: 04/16/22  3:06 PM  Result Value Ref Range   WBC 5.1 4.0 - 10.5 K/uL   RBC 3.81 (L) 4.22 - 5.81 MIL/uL   Hemoglobin 13.9 13.0 - 17.0 g/dL   HCT 40.0 39.0 - 52.0 %   MCV 105.0 (H) 80.0 - 100.0 fL   MCH 36.5 (H) 26.0 - 34.0 pg   MCHC 34.8 30.0 - 36.0 g/dL   RDW 13.6 11.5 - 15.5 %   Platelets 248 150 - 400 K/uL   nRBC 0.0 0.0 - 0.2 %    Comment: Performed at [facility], [address] [place], [place] [address]  Differential     Status: None   Collection Time: 04/16/22  3:06 PM  Result Value Ref Range   Neutrophils Relative % 58 %   Neutro Abs 3.0 1.7 - 7.7 K/uL   Lymphocytes Relative 25 %   Lymphs Abs 1.3 0.7 - 4.0 K/uL   Monocytes Relative 15 %   Monocytes Absolute 0.7 0.1 - 1.0 K/uL   Eosinophils Relative 1 %   Eosinophils Absolute 0.1 0.0 - 0.5 K/uL   Basophils Relative 1 %   Basophils Absolute 0.0 0.0 - 0.1 K/uL    Immature Granulocytes 0 %   Abs Immature Granulocytes 0.01 0.00 - 0.07 K/uL    Comment: Performed at [facility], [address] [place], [place] [address]  Comprehensive metabolic panel     Status: Abnormal   Collection Time: 04/16/22  3:06 PM  Result Value Ref Range   Sodium 132 (L) 135 - 145 mmol/L   Potassium 3.6 3.5 - 5.1 mmol/L   Chloride 97 (L) 98 - 111 mmol/L   CO2 25 22 - 32 mmol/L   Glucose, Bld 116 (H) 70 - 99 mg/dL    Comment: Glucose reference range applies only to samples taken after fasting for at least 8 hours.   BUN 5 (L) 6 - 20 mg/dL   Creatinine, Ser 0.64 0.61 - 1.24 mg/dL   Calcium 9.0 8.9 - 10.3 mg/dL   Total Protein 7.7 6.5 - 8.1 g/dL   Albumin 4.1 3.5 - 5.0 g/dL   AST 30 15 - 41 U/L   ALT 21 0 - 44 U/L   Alkaline Phosphatase 79 38 - 126 U/L   Total Bilirubin 0.8 0.3 - 1.2 mg/dL   GFR, Estimated >60 >60 mL/min    Comment: (NOTE) Calculated using the CKD-EPI Creatinine Equation (2021)    Anion gap 10 5 - 15    Comment: Performed at [facility], [address] [place], [place] [address]  Ethanol     Status: None   Collection Time: 04/16/22  3:06 PM  Result Value Ref Range   Alcohol, Ethyl (B) <10 <10 mg/dL    Comment: (NOTE) Lowest detectable limit for serum alcohol is 10 mg/dL.  For medical purposes only. Performed at [facility],  [place], [place] [address]   Urine Drug Screen, Qualitative     Status: None   Collection Time: 04/16/22  5:00 PM  Result Value Ref Range   Tricyclic, Ur Screen NONE DETECTED NONE DETECTED   Amphetamines, Ur Screen NONE DETECTED NONE DETECTED   MDMA (Ecstasy)Ur Screen NONE DETECTED NONE DETECTED   Cocaine Metabolite,Ur [place] NONE DETECTED NONE DETECTED   Opiate, Ur Screen NONE DETECTED NONE DETECTED   Phencyclidine (PCP) Ur S NONE DETECTED NONE DETECTED   Cannabinoid 50 Ng, Ur [place] NONE DETECTED NONE DETECTED   Barbiturates, Ur Screen NONE DETECTED NONE DETECTED    Benzodiazepine, Ur Scrn NONE DETECTED NONE DETECTED   Methadone Scn, Ur NONE DETECTED NONE DETECTED    Comment: (NOTE) Tricyclics + metabolites, urine    Cutoff 1000 ng/mL Amphetamines + metabolites, urine  Cutoff 1000 ng/mL MDMA (Ecstasy), urine              Cutoff 500 ng/mL Cocaine Metabolite, urine          Cutoff 300 ng/mL Opiate + metabolites, urine        Cutoff 300 ng/mL Phencyclidine (PCP), urine         Cutoff 25 ng/mL Cannabinoid, urine                 Cutoff 50 ng/mL Barbiturates + metabolites, urine  Cutoff 200 ng/mL Benzodiazepine, urine              Cutoff 200 ng/mL Methadone, urine                   Cutoff 300 ng/mL  The urine drug screen provides only a preliminary, unconfirmed analytical test result and should not be used for non-medical purposes. Clinical consideration and professional judgment should be applied to any positive drug screen result due to possible interfering substances. A more specific alternate chemical method must be used in order to obtain a confirmed analytical result. Gas chromatography / mass spectrometry (GC/MS) is the preferred confirm atory method. Performed at [facility], [address] [place],  [address]     .tropo   Assessment and Plan: Lung nodule Repeat imaging in 4 to 6 weeks  Hyponatremia Mild, check serum osmolality, cortisol and thyroid cascade  Macrocytosis Follow-up B12 and folate levels  Hypertension Restart losartan  Shortness of breath Resolved, no hypoxia, check D-dimer and a chest x-ray  Aphasia Negative MRI  really rules out stroke at this time.  Follow-up EEG is ordered.  Monitor clinically      Advance Care Planning:   Code Status: Not on file full code  Consults: Neurology consulted in the ER  Family Communication: Family was present at the bedside earlier I did not encounter family at this time  Severity of Illness: The appropriate patient status for this patient is  OBSERVATION. Observation status is judged to be reasonable and necessary in order to provide the required intensity of service to ensure the patient's safety. The patient's presenting symptoms, physical exam findings, and initial radiographic and laboratory data in the context of their medical condition is felt to place them at decreased risk for further clinical deterioration. Furthermore, it is anticipated that the patient will be medically stable for discharge from the hospital within 2 midnights of admission.   Author: Gertie Fey, MD 04/16/2022 10:09 PM  For on call review [email or web address].

## 2022-04-16 NOTE — ED Triage Notes (Signed)
Pt to ED POV brought by coworker for acute onset AMS today while at work, coworker states his LKW was around 0900 or 0930 today when she saw him. 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   Pt following commands and is oriented to place, time, self, situation. However is low to respond and not answering all questions. Naming objects appropriately (pen). No dysarthria noted. CBG 113.   Pt appears slightly ataxic on both upper extremities. Is able to reach examiner finger with difficulty. Pt complains feels like "I'm gonna pass out". EKG performed.

## 2022-04-16 NOTE — Progress Notes (Signed)
   04/16/22 1700  Spiritual Encounters  Type of Visit Initial  Care provided to: Pt and family  Conversation partners present during encounter Physician  Referral source Code page  Reason for visit Urgent spiritual support  OnCall Visit Yes  Spiritual Framework  Presenting Themes Caregiving needs  Community/Connection Family;Friend(s);Faith community;Spiritual leader  Patient Stress Factors None identified  Family Stress Factors Major life changes  Interventions  Spiritual Care Interventions Made Established relationship of care and support;Compassionate presence;Reflective listening  Intervention Outcomes  Outcomes Patient family open to [place] will continue to follow   Spoke to Patients family member mother. She has major concerns about her son's health. Spoke with mother to keep her clam during this time. Will follow up with patient later if they are move to a Room. Advise mother that a chaplain is here 24/7 for patient and family needs.

## 2022-04-16 NOTE — ED Notes (Signed)
Provider at bedside

## 2022-04-16 NOTE — Assessment & Plan Note (Signed)
Negative MRI  really rules out stroke at this time.  Follow-up EEG is ordered.  Monitor clinically

## 2022-04-16 NOTE — ED Notes (Signed)
This RN in triage did assess pronator drift, hand grip and facial symmetry and there was no pronator drift noted at that time.

## 2022-04-17 ENCOUNTER — Observation Stay (HOSPITAL_BASED_OUTPATIENT_CLINIC_OR_DEPARTMENT_OTHER)
Admit: 2022-04-17 | Discharge: 2022-04-17 | Disposition: A | Discharge status: Home / Self Care | Payer: Managed Care, Other (non HMO) | Attending: Internal Medicine | Admitting: Internal Medicine

## 2022-04-17 ENCOUNTER — Observation Stay: Payer: Managed Care, Other (non HMO)

## 2022-04-17 ENCOUNTER — Encounter: Payer: Self-pay | Admitting: Internal Medicine

## 2022-04-17 DIAGNOSIS — E871 Hypo-osmolality and hyponatremia: Secondary | ICD-10-CM | POA: Diagnosis not present

## 2022-04-17 DIAGNOSIS — R4182 Altered mental status, unspecified: Secondary | ICD-10-CM | POA: Diagnosis not present

## 2022-04-17 DIAGNOSIS — R2990 Unspecified symptoms and signs involving the nervous system: Secondary | ICD-10-CM

## 2022-04-17 DIAGNOSIS — G459 Transient cerebral ischemic attack, unspecified: Secondary | ICD-10-CM

## 2022-04-17 DIAGNOSIS — R0602 Shortness of breath: Secondary | ICD-10-CM | POA: Diagnosis not present

## 2022-04-17 DIAGNOSIS — R4701 Aphasia: Secondary | ICD-10-CM | POA: Diagnosis not present

## 2022-04-17 LAB — LIPID PANEL
Cholesterol: 197 mg/dL (ref 0–200)
HDL: 116 mg/dL (ref >40)
LDL Cholesterol: 66 mg/dL (ref 0–99)
Total CHOL/HDL Ratio: 1.7 RATIO
Triglycerides: 76 mg/dL (ref <150)
VLDL: 15 mg/dL (ref 0–40)

## 2022-04-17 LAB — ECHOCARDIOGRAM COMPLETE
AR max vel: 3 cm2
AV Area VTI: 3.06 cm2
AV Area mean vel: 2.66 cm2
AV Mean grad: 3 mmHg
AV Peak grad: 4.8 mmHg
Ao pk vel: 1.1 m/s
Area-P 1/2: 3.27 cm2
Height: 70 in
MV VTI: 2.69 cm2
S' Lateral: 2.2 cm
Weight: 2158.74 oz

## 2022-04-17 LAB — HIV ANTIBODY (ROUTINE TESTING W REFLEX): HIV Screen 4th Generation wRfx: NONREACTIVE

## 2022-04-17 LAB — HEMOGLOBIN A1C
Hgb A1c MFr Bld: 4.9 % (ref 4.8–5.6)
Mean Plasma Glucose: 94 mg/dL

## 2022-04-17 LAB — AMMONIA: Ammonia: 23 umol/L (ref 9–35)

## 2022-04-17 LAB — CORTISOL-AM, BLOOD: Cortisol - AM: 11.8 ug/dL (ref 6.7–22.6)

## 2022-04-17 LAB — VITAMIN B12: Vitamin B-12: 233 pg/mL (ref 180–914)

## 2022-04-17 LAB — FOLATE: Folate: 17.2 ng/mL (ref >5.9)

## 2022-04-17 LAB — CREATININE, SERUM
Creatinine, Ser: 0.55 mg/dL — ABNORMAL LOW (ref 0.61–1.24)
GFR, Estimated: >60 mL/min (ref >60)

## 2022-04-17 LAB — TSH: TSH: 3.66 u[IU]/mL (ref 0.350–4.500)

## 2022-04-17 MED ORDER — ASPIRIN 81 MG PO TBEC
81.0000 mg | DELAYED_RELEASE_TABLET | Freq: Every day | ORAL | Status: DC
  Administered 2022-04-17: 81 mg via ORAL
  Filled 2022-04-17: qty 1

## 2022-04-17 MED ORDER — ASPIRIN 81 MG PO TBEC: 81.0000 mg | DELAYED_RELEASE_TABLET | Freq: Every day | ORAL | 0 refills | Status: AC

## 2022-04-17 MED ORDER — CYANOCOBALAMIN 1000 MCG PO TABS: 1000.0000 ug | ORAL_TABLET | Freq: Every day | ORAL | 0 refills | Status: DC

## 2022-04-17 MED ORDER — THIAMINE MONONITRATE 100 MG PO TABS: 100.0000 mg | ORAL_TABLET | Freq: Every day | ORAL | Status: DC

## 2022-04-17 MED ORDER — TECHNETIUM TO 99M ALBUMIN AGGREGATED
4.0000 | Freq: Once | INTRAVENOUS | Status: AC | PRN
  Administered 2022-04-17: 4.13 via INTRAVENOUS

## 2022-04-17 MED ORDER — CYANOCOBALAMIN 500 MCG PO TABS
1000.0000 ug | ORAL_TABLET | Freq: Every day | ORAL | Status: DC
  Administered 2022-04-17: 1000 ug via ORAL
  Filled 2022-04-17: qty 2

## 2022-04-17 MED ORDER — FOLIC ACID 1 MG PO TABS
1.0000 mg | ORAL_TABLET | Freq: Every day | ORAL | Status: DC
  Administered 2022-04-17: 1 mg via ORAL
  Filled 2022-04-17: qty 1

## 2022-04-17 NOTE — ED Notes (Signed)
EEG at bedside.

## 2022-04-17 NOTE — Evaluation (Signed)
Physical Therapy Evaluation Patient Details Name: DESHON VERHOEVEN MRN: [id number] DOB: 05-07-1965 Today's Date: 04/17/2022  History of Present Illness  presented to ER secondary to acute onset of L-sided weakness, difficulty speaking; neuro work up unrevealing (imaging negative for acute infarct) and patient now returned to baseline  Clinical Impression  Patient resting on stretcher upon arrival to ED bay; alert and oriented, follows commands and agreeable to participation with session.  Denies pain; endorses full resolution of initial symptoms.  Speech fluent, intelligible; easily maintains conversation and effectively communicates wants/needs.  Bilat UE/LE strength and ROM grossly symmetrical and WFL; no focal weakness, sensory or coordination deficit appreciated.  Able to complete bed mobility with indep; sit/stand, basic transfers and gait (>300') without assist device, mod indep.  Demonstrates reciprocal stepping pattern with good step height/lenght, good trunk rotation and arm swing; complete head turns, changes of direction and start/stop without buckling, LOB or safety concern. Endorses performance is baseline for him; no remaining symptoms of weakness or imbalance noted  Appears to be at baseline level of functional ability; no acute PT/OT needs identified at this time.  Will complete initial orders; please re-consult should needs change.       Recommendations for follow up therapy are one component of a multi-disciplinary discharge planning process, led by the attending physician.  Recommendations may be updated based on patient status, additional functional criteria and insurance authorization.  Follow Up Recommendations No PT follow up      Assistance Recommended at Discharge None  Patient can return home with the following       Equipment Recommendations    Recommendations for Other Services       Functional Status Assessment Patient has not had a recent decline in their  functional status     Precautions / Restrictions Precautions Precautions: None      Mobility  Bed Mobility Overal bed mobility: Independent                  Transfers Overall transfer level: Modified independent Equipment used: None                    Ambulation/Gait Ambulation/Gait assistance: Modified independent (Device/Increase time) Gait Distance (Feet): 300 Feet Assistive device: None         General Gait Details: reciprocal stepping pattern with good step height/lenght, good trunk rotation and arm swing; complete head turns, changes of direction and start/stop without buckling, LOB or safety concern.  Endorses performance is baseline for him; no remaining symptoms of weakness or imbalance noted  Stairs            Wheelchair Mobility    Modified Rankin (Stroke Patients Only)       Balance Overall balance assessment: Modified Independent                                           Pertinent Vitals/Pain Pain Assessment Pain Assessment: No/denies pain    Home Living Family/patient expects to be discharged to:: Private residence Living Arrangements: Parent Available Help at Discharge: Family Type of Home: House Home Access: Stairs to enter Entrance Stairs-Rails: Psychiatric nurse of Steps: 4   Home Layout: One level Home Equipment: None      Prior Function Prior Level of Function : Independent/Modified Independent             Mobility Comments: Indep  with ADLs, household and community mobilization without assist deivce; working full-time at [employer]: Right    Extremity/Trunk Assessment   Upper Extremity Assessment Upper Extremity Assessment: Overall WFL for tasks assessed    Lower Extremity Assessment Lower Extremity Assessment: Overall WFL for tasks assessed       Communication   Communication: No difficulties  Cognition Arousal/Alertness:  Awake/alert Behavior During Therapy: WFL for tasks assessed/performed Overall Cognitive Status: Within Functional Limits for tasks assessed                                          General Comments      Exercises  Dynamic sitting edge of bed, indep dons bilat shoes; good core stability, good LE flexibility and UE/LE coordination.   Assessment/Plan    PT Assessment Patient does not need any further PT services  PT Problem List         PT Treatment Interventions      PT Goals (Current goals can be found in the Care Plan section)  Acute Rehab PT Goals Patient Stated Goal: to return home PT Goal Formulation: All assessment and education complete, DC therapy Time For Goal Achievement: 04/17/22 Potential to Achieve Goals: Good    Frequency       Co-evaluation               AM-PAC PT "6 Clicks" Mobility  Outcome Measure Help needed turning from your back to your side while in a flat bed without using bedrails?: None Help needed moving from lying on your back to sitting on the side of a flat bed without using bedrails?: None Help needed moving to and from a bed to a chair (including a wheelchair)?: None Help needed standing up from a chair using your arms (e.g., wheelchair or bedside chair)?: None Help needed to walk in hospital room?: None Help needed climbing 3-5 steps with a railing? : None 6 Click Score: 24    End of Session   Activity Tolerance: Patient tolerated treatment well Patient left: in bed;with call bell/phone within reach Nurse Communication: Mobility status PT Visit Diagnosis: Other symptoms and signs involving the nervous system (R29.898)    Time: 1355-1409 PT Time Calculation (min) (ACUTE ONLY): 14 min   Charges:   PT Evaluation $PT Eval Low Complexity: 1 Low         Lamiracle Chaidez H. Owens Shark, PT, DPT, NCS 04/17/22, 3:12 PM [phone]

## 2022-04-17 NOTE — ED Notes (Signed)
Pt to Nuclear med

## 2022-04-17 NOTE — Discharge Summary (Signed)
Physician Discharge Summary   Patient: Frederick Alvarez MRN: [id number] DOB: Oct 22, 1965  Admit date:     04/16/2022  Discharge date: 04/17/22  Discharge Physician: Sharen Hones   PCP: Maury Dus, MD (Inactive)   Recommendations at discharge:   Follow-up with PCP in 1 week. Follow-up with urology in 1 month Follow-up with PCP for lung nodule.  Discharge Diagnoses: Active Problems:   Aphasia   Shortness of breath   Hypertension   Macrocytosis   Hyponatremia   Lung nodule  Resolved Problems:   * No resolved hospital problems. [facility] Course: Patient is a 57 year old male with history of essential hypertension came to the hospital with complaints of sudden onset shortness of breath, dizziness, speaking disturbance and left-sided weakness. 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  No leg edema.  He moved 11 boxes with weight up 25 pounds yesterday, then felt shortness of breath and dizzy.  He also has some confusion.  Upon arriving the hospital, he had some speech disturbance, and briefly had a left-sided weakness.  Short of breath resolved in less than 30 minutes.  Speech disturbance also resolved pretty quickly. He is seen by neurology in the emergency room, MRI of the brain did not show any stroke.  CT head and neck angiogram did not show significant occlusion, did review a 6 mm lung nodule.  Chest x-ray did not show any acute changes.  He has an elevated D-dimer, VQ scan did not show any abnormality.  Echocardiogram showed ejection fraction 60 to 65% with normal diastolic dysfunction.  No valvular disease, EEG is within normal limits.  Patient has been seen by neurology, considered TIA versus near syncope episode.  Started on aspirin 81 mg daily, patient B12 level borderline, started 1 mg daily.  Patient also drinking alcohol, advised to quit.  Neurology also recommended thiamine and folic acid. Patient symptoms so far has improved, medically stable to be  discharged.  Assessment and Plan:  Lung nodule Repeat imaging in 4 to 6 weeks  Hyponatremia Alcohol abuse. Be secondary to alcohol drinking.  Sodium level seem to be better today. Advised to quit alcohol drinking.  Macrocytosis B12 level borderline low at 233, homocystine level sent out results pending.  Will continue B12 tablets.  Hypertension Start home medicines.  Shortness of breath Shortness of breath episode, troponin negative.  Echocardiogram negative.  D-dimer elevated, pulmonary perfusion scan negative for thrombosis.  Condition has resolved.  Aphasia Negative MRI  really rules out stroke at this time.  EEG was also negative.      Consultants: Neurology Procedures performed: None  Disposition: Home Diet recommendation:  Discharge Diet Orders (From admission, onward)     Start     Ordered   04/17/22 0000  Diet - low sodium heart healthy        04/17/22 1444           Cardiac diet DISCHARGE MEDICATION: Allergies as of 04/17/2022   No Known Allergies      Medication List     TAKE these medications    amLODipine 5 MG tablet Commonly known as: NORVASC Take 5 mg by mouth daily.   aspirin EC 81 MG tablet Take 1 tablet (81 mg total) by mouth daily. Swallow whole.   cyanocobalamin 1000 MCG tablet Take 1 tablet (1,000 mcg total) by mouth daily.   folic acid 1 MG tablet Commonly known as: FOLVITE Take 1 tablet (1 mg total) by mouth daily.  olmesartan 40 MG tablet Commonly known as: BENICAR Take 40 mg by mouth daily.   promethazine-dextromethorphan 6.25-15 MG/5ML syrup Commonly known as: PROMETHAZINE-DM Take 5 mLs by mouth every 4 (four) hours as needed for cough.   thiamine 100 MG tablet Commonly known as: VITAMIN B1 Take 1 tablet (100 mg total) by mouth daily.        Follow-up Information     Maury Dus, MD Follow up in 1 week(s).   Specialty: Family Medicine Contact information: [address] [place]  [address] [place] [facility] Follow up in 1 month(s).   Contact information: [place] [phone]               Discharge Exam: Danley Danker Weights   04/16/22 1502  Weight: 61.2 kg   General exam: Appears calm and comfortable  Respiratory system: Clear to auscultation. Respiratory effort normal. Cardiovascular system: S1 & S2 heard, RRR. No JVD, murmurs, rubs, gallops or clicks. No pedal edema. Gastrointestinal system: Abdomen is nondistended, soft and nontender. No organomegaly or masses felt. Normal bowel sounds heard. Central nervous system: Alert and oriented. No focal neurological deficits. Extremities: Symmetric 5 x 5 power. Skin: No rashes, lesions or ulcers Psychiatry: Judgement and insight appear normal. Mood & affect appropriate.    Condition at discharge: good  The results of significant diagnostics from this hospitalization (including imaging, microbiology, ancillary and laboratory) are listed below for reference.   Imaging Studies: EEG adult  Result Date: 2022-04-27 Lora Havens, MD     04/27/2022  1:51 PM Patient Name: Frederick Alvarez MRN: [id number] Epilepsy Attending: Lora Havens Referring Physician/Provider: Rosalin Hawking, MD Date: 04/27/22 Duration: 28.40 mins Patient history: 57yo M with ams. EEG to evaluate for seizure Level of alertness: Awake, asleep AEDs during EEG study: None Technical aspects: This EEG study was done with scalp electrodes positioned according to the 10-20 International system of electrode placement. Electrical activity was reviewed with band pass filter of 1-'70Hz'$ , sensitivity of 7 uV/mm, display speed of 61m/sec with a '60Hz'$  notched filter applied as appropriate. EEG data were recorded continuously and digitally stored.  Video monitoring was available and reviewed as appropriate. Description: The posterior dominant rhythm consists of 8.5 Hz activity  of moderate voltage (25-35 uV) seen predominantly in posterior head regions, symmetric and reactive to eye opening and eye closing. Sleep was characterized by vertex waves, sleep spindles (12 to 14 Hz), maximal frontocentral region. Hyperventilation and photic stimulation were not performed.   IMPRESSION: This study is within normal limits. No seizures or epileptiform discharges were seen throughout the recording. A normal interictal EEG does not exclude the diagnosis of epilepsy. PLora Havens  ECHOCARDIOGRAM COMPLETE  Result Date: 303-17-24   ECHOCARDIOGRAM REPORT   Patient Name:   Frederick SKILESDate of Exam: 32024/03/17Medical Rec #:  [id number]     Height:       70.0 in Accession #:    [id number]    Weight:       134.9 lb Date of Birth:  [birth year]    BSA:          1.766 m Patient Age:    89years       BP:           108/77 mmHg Patient Gender: M  HR:           74 bpm. Exam Location:  [facility] Procedure: 2D Echo, Cardiac Doppler and Color Doppler Indications:     Stroke  History:         Patient has no prior history of Echocardiogram examinations.                  Stroke, Signs/Symptoms:Shortness of Breath; Risk                  Factors:Hypertension and Current Smoker.  Sonographer:     Wenda Low Referring Phys:  [id number] [facility] GOEL Diagnosing Phys: Ida Rogue MD  Sonographer Comments: Technically difficult study due to poor echo windows and suboptimal apical window. Image acquisition challenging due to patient body habitus. IMPRESSIONS  1. Left ventricular ejection fraction, by estimation, is 60 to 65%. The left ventricle has normal function. The left ventricle has no regional wall motion abnormalities. Left ventricular diastolic parameters were normal.  2. Right ventricular systolic function is normal. The right ventricular size is normal. There is normal pulmonary artery systolic pressure. The estimated right ventricular systolic pressure is Q000111Q mmHg.  3. The mitral valve is  normal in structure. Mild mitral valve regurgitation. No evidence of mitral stenosis.  4. Tricuspid valve regurgitation is mild to moderate.  5. The aortic valve is normal in structure. Aortic valve regurgitation is not visualized. No aortic stenosis is present.  6. The inferior vena cava is normal in size with greater than 50% respiratory variability, suggesting right atrial pressure of 3 mmHg. FINDINGS  Left Ventricle: Left ventricular ejection fraction, by estimation, is 60 to 65%. The left ventricle has normal function. The left ventricle has no regional wall motion abnormalities. The left ventricular internal cavity size was normal in size. There is  no left ventricular hypertrophy. Left ventricular diastolic parameters were normal. Right Ventricle: The right ventricular size is normal. No increase in right ventricular wall thickness. Right ventricular systolic function is normal. There is normal pulmonary artery systolic pressure. The tricuspid regurgitant velocity is 2.76 m/s, and  with an assumed right atrial pressure of 3 mmHg, the estimated right ventricular systolic pressure is Q000111Q mmHg. Left Atrium: Left atrial size was normal in size. Right Atrium: Right atrial size was normal in size. Pericardium: There is no evidence of pericardial effusion. Mitral Valve: The mitral valve is normal in structure. Mild mitral valve regurgitation. No evidence of mitral valve stenosis. MV peak gradient, 3.7 mmHg. The mean mitral valve gradient is 1.0 mmHg. Tricuspid Valve: The tricuspid valve is normal in structure. Tricuspid valve regurgitation is mild to moderate. No evidence of tricuspid stenosis. Aortic Valve: The aortic valve is normal in structure. Aortic valve regurgitation is not visualized. No aortic stenosis is present. Aortic valve mean gradient measures 3.0 mmHg. Aortic valve peak gradient measures 4.8 mmHg. Aortic valve area, by VTI measures 3.06 cm. Pulmonic Valve: The pulmonic valve was normal in  structure. Pulmonic valve regurgitation is not visualized. No evidence of pulmonic stenosis. Aorta: The aortic root is normal in size and structure. Venous: The inferior vena cava is normal in size with greater than 50% respiratory variability, suggesting right atrial pressure of 3 mmHg. IAS/Shunts: No atrial level shunt detected by color flow Doppler.  LEFT VENTRICLE PLAX 2D LVIDd:         4.00 cm   Diastology LVIDs:         2.20 cm   LV e' medial:    9.57 cm/s  LV PW:         1.00 cm   LV E/e' medial:  8.1 LV IVS:        0.70 cm   LV e' lateral:   9.46 cm/s LVOT diam:     2.20 cm   LV E/e' lateral: 8.2 LV SV:         71 LV SV Index:   40 LVOT Area:     3.80 cm  RIGHT VENTRICLE RV Basal diam:  3.05 cm RV Mid diam:    2.40 cm RV S prime:     16.00 cm/s LEFT ATRIUM           Index        RIGHT ATRIUM           Index LA diam:      2.20 cm 1.25 cm/m   RA Area:     11.50 cm LA Vol (A4C): 18.4 ml 10.42 ml/m  RA Volume:   27.40 ml  15.52 ml/m  AORTIC VALVE                    PULMONIC VALVE AV Area (Vmax):    3.00 cm     PV Vmax:       1.10 m/s AV Area (Vmean):   2.66 cm     PV Peak grad:  4.8 mmHg AV Area (VTI):     3.06 cm AV Vmax:           110.00 cm/s AV Vmean:          73.300 cm/s AV VTI:            0.232 m AV Peak Grad:      4.8 mmHg AV Mean Grad:      3.0 mmHg LVOT Vmax:         86.90 cm/s LVOT Vmean:        51.300 cm/s LVOT VTI:          0.187 m LVOT/AV VTI ratio: 0.81  AORTA Ao Root diam: 3.30 cm MITRAL VALVE               TRICUSPID VALVE MV Area (PHT): 3.27 cm    TR Peak grad:   30.5 mmHg MV Area VTI:   2.69 cm    TR Vmax:        276.00 cm/s MV Peak grad:  3.7 mmHg MV Mean grad:  1.0 mmHg    SHUNTS MV Vmax:       0.96 m/s    Systemic VTI:  0.19 m MV Vmean:      53.6 cm/s   Systemic Diam: 2.20 cm MV Decel Time: 232 msec MV E velocity: 77.20 cm/s MV A velocity: 67.30 cm/s MV E/A ratio:  1.15 Ida Rogue MD Electronically signed by Ida Rogue MD Signature Date/Time: 04/17/2022/12:54:22 PM    Final     NM Pulmonary Perfusion  Result Date: 04/17/2022 CLINICAL DATA:  Confusion, disorientation and elevated D-dimer. EXAM: NUCLEAR MEDICINE PERFUSION LUNG SCAN TECHNIQUE: Perfusion images were obtained in multiple projections after intravenous injection of radiopharmaceutical. Ventilation scans intentionally deferred if perfusion scan and chest x-ray adequate for interpretation during COVID 19 epidemic. RADIOPHARMACEUTICALS:  4.1 mCi Tc-44mMAA IV COMPARISON:  Chest x-ray earlier today. FINDINGS: Perfusion imaging in a different projections demonstrates no significant perfusion defects in either lung. No evidence to suggest pulmonary embolism. IMPRESSION: Normal pulmonary perfusion by nuclear medicine imaging. No evidence to suggest pulmonary embolism. Electronically  Signed   By: Aletta Edouard M.D.   On: 04/17/2022 12:53   DG Chest 2 View  Result Date: 04/16/2022 CLINICAL DATA:  Shortness of breath. EXAM: CHEST - 2 VIEW COMPARISON:  Chest radiograph dated 08/10/2018. FINDINGS: The lungs are clear. There is no pleural effusion or pneumothorax. The cardiac silhouette is within limits. Atherosclerotic calcification of the aorta. No acute osseous pathology. IMPRESSION: No active cardiopulmonary disease. Electronically Signed   By: Anner Crete M.D.   On: 04/16/2022 22:48   MR BRAIN W WO CONTRAST  Result Date: 04/16/2022 CLINICAL DATA:  Altered mental status EXAM: MRI HEAD WITHOUT AND WITH CONTRAST TECHNIQUE: Multiplanar, multiecho pulse sequences of the brain and surrounding structures were obtained without and with intravenous contrast. CONTRAST:  61m GADAVIST GADOBUTROL 1 MMOL/ML IV SOLN COMPARISON:  No prior MRI available, correlation is made with CT head 04/16/2022 FINDINGS: Brain: No restricted diffusion to suggest acute or subacute infarct. No abnormal parenchymal or meningeal enhancement. No acute hemorrhage, mass, mass effect, or midline shift. No hydrocephalus or extra-axial collection. Normal  pituitary and craniocervical junction. No hemosiderin deposition to suggest remote hemorrhage. Scattered T2 hyperintense signal in the periventricular white matter, likely the sequela of minimal chronic small vessel ischemic disease. Vascular: Normal arterial flow voids. Normal arterial and venous enhancement. Skull and upper cervical spine: Normal marrow signal. Sinuses/Orbits: Small air-fluid level in the left maxillary sinus. Otherwise clear paranasal sinuses. No acute finding in the orbits. Other: Fluid in the right mastoid air cells. IMPRESSION: 1. No acute intracranial process. 2. Small air-fluid level in the left maxillary sinus, which is nonspecific but can be seen in the setting of acute sinusitis. Electronically Signed   By: AMerilyn BabaM.D.   On: 04/16/2022 21:17   CT ANGIO HEAD NECK W WO CM W PERF (CODE STROKE)  Result Date: 04/16/2022 CLINICAL DATA:  Neuro deficit, acute, stroke suspected EXAM: CT ANGIOGRAPHY HEAD AND NECK CT PERFUSION BRAIN TECHNIQUE: Multidetector CT imaging of the head and neck was performed using the standard protocol during bolus administration of intravenous contrast. Multiplanar CT image reconstructions and MIPs were obtained to evaluate the vascular anatomy. Carotid stenosis measurements (when applicable) are obtained utilizing NASCET criteria, using the distal internal carotid diameter as the denominator. Multiphase CT imaging of the brain was performed following IV bolus contrast injection. Subsequent parametric perfusion maps were calculated using RAPID software. RADIATION DOSE REDUCTION: This exam was performed according to the departmental dose-optimization program which includes automated exposure control, adjustment of the mA and/or kV according to patient size and/or use of iterative reconstruction technique. CONTRAST:  1053mOMNIPAQUE IOHEXOL 350 MG/ML SOLN COMPARISON:  Same day CT head. FINDINGS: CTA NECK FINDINGS Aortic arch: Great vessel origins are patent  without significant stenosis. Atherosclerosis. Right carotid system: Atherosclerosis at the carotid bifurcation without greater than 50% stenosis. Left carotid system: Atherosclerosis at the carotid bifurcation without greater than 50% stenosis. Vertebral arteries: Codominant. No evidence of dissection, stenosis (50% or greater), or occlusion. Skeleton: No acute fracture. Other neck: No acute findings. Upper chest: Biapical pleuroparenchymal scarring. Emphysema. Review of the MIP images confirms the above findings CTA HEAD FINDINGS Anterior circulation: Bilateral intracranial ICAs, MCAs, and ACAs patent without proximal hemodynamically significant stenosis. Posterior circulation: Bilateral intradural vertebral arteries basilar artery and bilateral posterior cerebral arteries are patent without proximal hemodynamically significant stenosis. Venous sinuses: As permitted by contrast timing, patent. CT Brain Perfusion Findings: ASPECTS: 10. CBF (<30%) Volume: 19m619merfusion (Tmax>6.0s) volume: 19mL47msmatch Volume: 19mL 106marction Location:None  identified. IMPRESSION: 1. No large vessel occlusion or proximal hemodynamically significant stenosis. 2. No evidence of core infarct or penumbra on CT perfusion. 3. Bilateral carotid bifurcation atherosclerosis without greater than 50% stenosis. 4. Approximately 6 mm pulmonary nodule at the left lung apex. Non-contrast chest CT at 6-12 months is recommended. If the nodule is stable at time of repeat CT, then future CT at 18-24 months (from today's scan) is considered optional for low-risk patients, but is recommended for high-risk patients. This recommendation follows the consensus statement: Guidelines for Management of Incidental Pulmonary Nodules Detected on CT Images: From the Fleischner Society 2017; Radiology 2017; 284:228-243. 5. Aortic Atherosclerosis (ICD10-I70.0) and Emphysema (ICD10-J43.9). Electronically Signed   By: Margaretha Sheffield M.D.   On: 04/16/2022 17:44   CT  HEAD WO CONTRAST  Result Date: 04/16/2022 CLINICAL DATA:  Mental status change, unknown cause EXAM: CT HEAD WITHOUT CONTRAST TECHNIQUE: Contiguous axial images were obtained from the base of the skull through the vertex without intravenous contrast. RADIATION DOSE REDUCTION: This exam was performed according to the departmental dose-optimization program which includes automated exposure control, adjustment of the mA and/or kV according to patient size and/or use of iterative reconstruction technique. COMPARISON:  11/11/2012 FINDINGS: Brain: No evidence of acute infarction, hemorrhage, hydrocephalus, extra-axial collection or mass lesion/mass effect. Vascular: No hyperdense vessel or unexpected calcification. Skull: Normal. Negative for fracture or focal lesion. Sinuses/Orbits: No acute finding. Other: None. IMPRESSION: No acute intracranial abnormality. Electronically Signed   By: Davina Poke D.O.   On: 04/16/2022 15:38    Microbiology: Results for orders placed or performed during the hospital encounter of 09/02/13  Culture, Group A Strep     Status: None   Collection Time: 09/02/13  4:30 PM   Specimen: Throat  Result Value Ref Range Status   Specimen Description THROAT  Final   Special Requests NONE  Final   Culture   Final    No Beta Hemolytic Streptococci Isolated Performed at [facility]   Report Status 09/04/2013 FINAL  Final    Labs: CBC: Recent Labs  Lab 04/16/22 1506 04/16/22 2330  WBC 5.1 4.1  NEUTROABS 3.0  --   HGB 13.9 13.2  HCT 40.0 38.2*  MCV 105.0* 107.0*  PLT 248 [id number]   Basic Metabolic Panel: Recent Labs  Lab 04/16/22 1506 04/16/22 2330  NA 132*  --   K 3.6  --   CL 97*  --   CO2 25  --   GLUCOSE 116*  --   BUN 5*  --   CREATININE 0.64 0.55*  CALCIUM 9.0  --    Liver Function Tests: Recent Labs  Lab 04/16/22 1506  AST 30  ALT 21  ALKPHOS 79  BILITOT 0.8  PROT 7.7  ALBUMIN 4.1   CBG: Recent Labs  Lab 04/16/22 1455  GLUCAP 113*     Discharge time spent: greater than 30 minutes.  Signed: Sharen Hones, MD [facility] 04/17/2022

## 2022-04-17 NOTE — Progress Notes (Signed)
*  PRELIMINARY RESULTS* Echocardiogram 2D Echocardiogram has been performed.  Elpidio Anis 04/17/2022, 9:18 AM

## 2022-04-17 NOTE — ED Notes (Signed)
Notified NP Foust about patient's diet order. Patient passed swallow test. New orders placed.

## 2022-04-17 NOTE — Progress Notes (Signed)
OT Cancellation Note  Patient Details Name: Frederick Alvarez MRN: [id number] DOB: 1965-07-17   Cancelled Treatment:    Reason Eval/Treat Not Completed: OT screened, no needs identified, will sign off. Per PT, pt is at independent baseline level of functioning for all needs. No need for skilled OT intervention. OT to complete orders at this time.   Darleen Crocker, MS, OTR/L , CBIS ascom [phone]  04/17/22, 2:21 PM

## 2022-04-17 NOTE — ED Notes (Signed)
PT at bedside.

## 2022-04-17 NOTE — Procedures (Signed)
Patient Name: Frederick Alvarez  MRN: [id number]  Epilepsy Attending: Lora Havens  Referring Physician/Provider: Rosalin Hawking, MD  Date: 04/17/2022 Duration: 28.40 mins  Patient history: 57yo M with ams. EEG to evaluate for seizure  Level of alertness: Awake, asleep  AEDs during EEG study: None  Technical aspects: This EEG study was done with scalp electrodes positioned according to the 10-20 International system of electrode placement. Electrical activity was reviewed with band pass filter of 1-'70Hz'$ , sensitivity of 7 uV/mm, display speed of 9m/sec with a '60Hz'$  notched filter applied as appropriate. EEG data were recorded continuously and digitally stored.  Video monitoring was available and reviewed as appropriate.  Description: The posterior dominant rhythm consists of 8.5 Hz activity of moderate voltage (25-35 uV) seen predominantly in posterior head regions, symmetric and reactive to eye opening and eye closing. Sleep was characterized by vertex waves, sleep spindles (12 to 14 Hz), maximal frontocentral region. Hyperventilation and photic stimulation were not performed.     IMPRESSION: This study is within normal limits. No seizures or epileptiform discharges were seen throughout the recording.  A normal interictal EEG does not exclude the diagnosis of epilepsy.  [name] [name]

## 2022-04-17 NOTE — Progress Notes (Signed)
Eeg done 

## 2022-04-17 NOTE — Progress Notes (Signed)
Neurology Progress Note  Patient ID: Frederick Alvarez is a 57 y.o. with PMHx of  has a past medical history of Hypertension.  HPI by my colleague: 61 yo M with Hx of HTN, smoker, alcohol, sinusitis presented to ED for code stroke. Mom at bedside stated that pt lives with mom, he went to work at [employer] at 7:30am, at that time he was fine. Per chart, he was at work doing well at 9:00am. 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 And he came back at 1:30pm and was not himself, felt confused and disorientated, left arm tingling. And he was found to be slow responding, slow answer questions. Per pt he drove back from [place], felt blurry vision and confused, not sure what exact time onset. He told his supervision about 2pm and he was sent to ER for evaluation. Mom met him in ED and he did not recognize his mom. Glucose 113, BP 155/92. CT head no acute finding, CTA head and neck and CTP unremarkable except b/l carotid bulb athero but no stenosis.    He has been in ER for dizziness several time in the last several years. Last one was on 03/21/22 and was told to be dehydrated and possible mild alcohol withdraw. Per mom, he has hx of HTN on BP meds. He is current smoker and drinks 1-2 beers per day. Denies illicit drugs.  Major interval events/Subjective: -Reports feeling back to his baseline at this time, and describes that he was initially just confused and then had difficulty getting his words out although he knew what he wanted to say  Exam: Current vital signs: BP 127/74   Pulse 60   Temp 98.1 F (36.7 C)   Resp 14   Ht '5\' 10"'$  (1.778 m)   Wt 61.2 kg   SpO2 98%   BMI 19.36 kg/m  Vital signs in last 24 hours: Temp:  [98 F (36.7 C)-98.6 F (37 C)] 98.1 F (36.7 C) (03/07 1000) Pulse Rate:  [60-86] 60 (03/07 1100) Resp:  [13-21] 14 (03/07 1100) BP: (108-167)/(73-89) 127/74 (03/07 1100) SpO2:  [97 %-100 %] 98 % (03/07 1100) Weight:  [61.2 kg] 61.2 kg (03/06 1502)   Gen: In bed, comfortable   Resp: non-labored breathing, no grossly audible wheezing Cardiac: Perfusing extremities well  Abd: soft, nt  Neuro: MS: Awake, alert, conversant, oriented, naming and repetition intact CN: EOMI, face symmetric, orients to stimuli in all visual fields, tongue midline Motor: No pronator drift Sensory: Intact to light touch throughout Coordination: Finger-nose intact bilaterally, heel-to-shin intact bilaterally, mild physiologic tremor  Pertinent Labs:  Basic Metabolic Panel: Recent Labs  Lab 04/16/22 1506 04/16/22 2330  NA 132*  --   K 3.6  --   CL 97*  --   CO2 25  --   GLUCOSE 116*  --   BUN 5*  --   CREATININE 0.64 0.55*  CALCIUM 9.0  --     CBC: Recent Labs  Lab 04/16/22 1506 04/16/22 2330  WBC 5.1 4.1  NEUTROABS 3.0  --   HGB 13.9 13.2  HCT 40.0 38.2*  MCV 105.0* 107.0*  PLT 248 216    Coagulation Studies: Recent Labs    04/16/22 1506  LABPROT 12.3  INR 0.9     Folate 17.2 B12 233; last check 2/9 329 Ammonia and TSH WNL Troponin normal HIV negative AM cortisol 11.8 D-dimer 1.47 (ref range less than 0.5)  Pending Homocysteine Hemoglobin A1c   Lab Results  Component Value Date   VITAMINB12 329 03/21/2022    UDS negative  EtOH undetectable   CTA head and neck: 1. No large vessel occlusion or proximal hemodynamically significant stenosis. 2. No evidence of core infarct or penumbra on CT perfusion. 3. Bilateral carotid bifurcation atherosclerosis without greater than 50% stenosis. 4. Approximately 6 mm pulmonary nodule at the left lung apex. Non-contrast chest CT at 6-12 months is recommended. If the nodule is stable at time of repeat CT, then future CT at 18-24 months (from today's scan) is considered optional for low-risk patients, but is recommended for high-risk patients. This recommendation follows the consensus statement: Guidelines for Management of Incidental Pulmonary Nodules Detected on CT Images: From the Fleischner  Society 2017; Radiology 2017; 284:228-243. 5. Aortic Atherosclerosis (ICD10-I70.0) and Emphysema (ICD10-J43.9).   EEG  This study is within normal limits. No seizures or epileptiform discharges were seen throughout the recording. A normal interictal EEG does not exclude the diagnosis of epilepsy.  ECHO   1. Left ventricular ejection fraction, by estimation, is 60 to 65%. The  left ventricle has normal function. The left ventricle has no regional  wall motion abnormalities. Left ventricular diastolic parameters were  normal.   2. Right ventricular systolic function is normal. The right ventricular  size is normal. There is normal pulmonary artery systolic pressure. The  estimated right ventricular systolic pressure is [id number] mmHg.   3. The mitral valve is normal in structure. Mild mitral valve  regurgitation. No evidence of mitral stenosis.   4. Tricuspid valve regurgitation is mild to moderate.   5. The aortic valve is normal in structure. Aortic valve regurgitation is  not visualized. No aortic stenosis is present.   6. The inferior vena cava is normal in size with greater than 50%  respiratory variability, suggesting right atrial pressure of 3 mmHg.   NM perfusion Normal pulmonary perfusion by nuclear medicine imaging. No evidence to suggest pulmonary embolism.    Impression: Unclear etiology of transient confusion.  Could consider a presyncope event given prior presentations for dizziness and alcohol use, however his pressure was actually elevated slightly on arrival and to me he describes feeling like he cannot get his words out.  After consideration of risk/benefit will add 81 mg aspirin daily as well as vitamin supplementation  Recommendations:  # Transient altered mental status, possible TIA versus brief confusional episode from presyncope # Heavy daily drinking, motivated to stop gradually > workup un-revealing - Aspirin 81 mg at this time, low threshold to discontinue on an  outpatient basis - Outpatient neurocognitive evaluation - Goal B12 level greater than 400, 1000 mcg daily p.o. ordered and should be continued outpatient - Continue thiamine 100 mg daily p.o. outpatient - Continue folate 1 g daily outpatient p.o. - Alcohol cessation counseling provided, patient is receptive and will plan to gradually taper his alcohol use and seek outpatient resources including [employer]  #Smoking -Patient counseled and provided [place] quit line resources for which he was appreciative -Please prescribe patches and gum on discharge  # Lung nodule - Repeat CT chest in 6-12 months, if stable then again at 18-24 -months per radiology recommendations/guidelines  Lesleigh Noe MD-PhD [facility] [phone]   Recommendations conveyed to primary team via secure chat

## 2022-04-17 NOTE — Hospital Course (Addendum)
Patient is a 57 year old male with history of essential hypertension came to the hospital with complaints of sudden onset shortness of breath, dizziness, speaking disturbance and left-sided weakness. 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  No leg edema.  He moved 11 boxes with weight up 25 pounds yesterday, then felt shortness of breath and dizzy.  He also has some confusion.  Upon arriving the hospital, he had some speech disturbance, and briefly had a left-sided weakness.  Short of breath resolved in less than 30 minutes.  Speech disturbance also resolved pretty quickly. He is seen by neurology in the emergency room, MRI of the brain did not show any stroke.  CT head and neck angiogram did not show significant occlusion, did review a 6 mm lung nodule.  Chest x-ray did not show any acute changes.  He has an elevated D-dimer, VQ scan did not show any abnormality.  Echocardiogram showed ejection fraction 60 to 65% with normal diastolic dysfunction.  No valvular disease, EEG is within normal limits.  Patient has been seen by neurology, considered TIA versus near syncope episode.  Started on aspirin 81 mg daily, patient B12 level borderline, started 1 mg daily.  Patient also drinking alcohol, advised to quit.  Neurology also recommended thiamine and folic acid. Patient symptoms so far has improved, medically stable to be discharged.

## 2022-04-17 NOTE — Evaluation (Signed)
Speech Language Pathology Evaluation Patient Details Name: Frederick Alvarez MRN: [id number] DOB: 08-Mar-1965 Today's Date: 04/17/2022 Time: [id number] SLP Time Calculation (min) (ACUTE ONLY): 15 min  Problem List:  Patient Active Problem List   Diagnosis Date Noted   Aphasia 04/16/2022   Shortness of breath 04/16/2022   Hypertension 04/16/2022   Macrocytosis 04/16/2022   Hyponatremia 04/16/2022   Lung nodule 04/16/2022   Past Medical History:  Past Medical History:  Diagnosis Date   Hypertension    Past Surgical History: History reviewed. No pertinent surgical history. HPI:  Per H&P "Frederick Alvarez is a 57 y.o. 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  At the time he reports new onset of sensation of shortness of breath associated with trouble walking.  Patient initially denied to me that he had any trouble speaking but then he states that when he gave came to the ER he could not speak.  Therefore patient's history is a little unreliable at this time.  From report from the ER as well as from EMS and coworkers at around 930 patient started having trouble speaking and weakness on the left side and therefore was brought to [facility] ER.  All of patient's neurologic symptoms have since resolved the patient has no current weakness and has been speaking fluently since.  Patient has no headache no vision changes no neck pain no trauma no loss of consciousness no tremor no presyncope." MRI 04/16/22 "1. No acute intracranial process.  2. Small air-fluid level in the left maxillary sinus, which is  nonspecific but can be seen in the setting of acute sinusitis."   Assessment / Plan / Recommendation Clinical Impression  Pt seen for cognitive-linguistic evaluation. Pt alert, pleasant, and cooperative. Endorses complete resolution of speech s/sx. Assessment completed via informal means and portions of  Western Aphasia Battery Revised (Bedside Record Form). Pt demosntrated intact basic, primary speech/language ability as well as intact functional cognitive-linguistic ability. No f/u SLP services are warranted at this time. SLP to sign off. Pt and RN made aware of results, recommendations, and SLP POC. Pt verbalized understanding/agreement.    SLP Assessment  SLP Recommendation/Assessment: Patient does not need any further Speech [facility] SLP Visit Diagnosis: Aphasia (R47.01)    Recommendations for follow up therapy are one component of a multi-disciplinary discharge planning process, led by the attending physician.  Recommendations may be updated based on patient status, additional functional criteria and insurance authorization.    Follow Up Recommendations  No SLP follow up    Assistance Recommended at Discharge   (defer to OT/PT)  Functional Status Assessment Patient has not had a recent decline in their functional status        SLP Evaluation Cognition  Overall Cognitive Status: Within Functional Limits for tasks assessed Arousal/Alertness: Awake/alert Orientation Level: Oriented X4 Memory: Appears intact Awareness: Appears intact Problem Solving: Appears intact Safety/Judgment: Appears intact       Comprehension  Auditory Comprehension Overall Auditory Comprehension: Appears within functional limits for tasks assessed Yes/No Questions: Within Functional Limits Commands: Within Functional Limits Conversation: Complex [facility]) Visual Recognition/Discrimination Discrimination: Not tested Reading Comprehension Reading Status: Not tested    Expression Expression Primary Mode of Expression: Verbal Verbal Expression Overall Verbal Expression: Appears within functional limits for tasks assessed Initiation: No impairment Automatic Speech:  (WFL) Repetition: No impairment Naming: No impairment Pragmatics: No impairment Written  Expression Written  Expression: Not tested   Oral / Motor  Oral Motor/Sensory Function Overall Oral Motor/Sensory Function: Within functional limits Motor Speech Overall Motor Speech: Appears within functional limits for tasks assessed Respiration: Within functional limits Phonation: Normal Resonance: Within functional limits Articulation: Within functional limitis Intelligibility: Intelligible Motor Planning: Witnin functional limits           Cherrie Gauze, M.S., [facility] [phone] [place])  Quintella Baton 04/17/2022, 9:33 AM

## 2022-04-18 LAB — THYROID PANEL WITH TSH
Free Thyroxine Index: 1.2 (ref 1.2–4.9)
T3 Uptake Ratio: 28 % (ref 24–39)
T4, Total: 4.4 ug/dL — ABNORMAL LOW (ref 4.5–12.0)
TSH: 3.81 u[IU]/mL (ref 0.450–4.500)

## 2022-04-21 LAB — VITAMIN B1: Vitamin B1 (Thiamine): 184 nmol/L (ref 66.5–200.0)

## 2022-05-06 ENCOUNTER — Encounter: Payer: Self-pay | Admitting: Cardiology

## 2022-05-06 ENCOUNTER — Ambulatory Visit: Payer: Managed Care, Other (non HMO) | Admitting: Cardiology

## 2022-05-06 VITALS — BP 136/83 | HR 95 | Ht 70.0 in | Wt 114.0 lb

## 2022-05-06 DIAGNOSIS — F17210 Nicotine dependence, cigarettes, uncomplicated: Secondary | ICD-10-CM

## 2022-05-06 DIAGNOSIS — I10 Essential (primary) hypertension: Secondary | ICD-10-CM

## 2022-05-06 DIAGNOSIS — G459 Transient cerebral ischemic attack, unspecified: Secondary | ICD-10-CM

## 2022-05-06 DIAGNOSIS — E782 Mixed hyperlipidemia: Secondary | ICD-10-CM

## 2022-05-06 DIAGNOSIS — I6523 Occlusion and stenosis of bilateral carotid arteries: Secondary | ICD-10-CM

## 2022-05-06 DIAGNOSIS — F1010 Alcohol abuse, uncomplicated: Secondary | ICD-10-CM

## 2022-05-06 NOTE — Progress Notes (Signed)
ID:  Frederick Alvarez, DOB [birth year], MRN [id number]  PCP:  Maury Dus, MD (Inactive)  Cardiologist:  Rex Kras, DO, [facility] (established care 05/06/2022)  REASON FOR CONSULT: History of TIA  REQUESTING PHYSICIAN:  Olen Cordial, PA [address] [place],  [place] [address]  Chief Complaint  Patient presents with   Transient Ischemic Attack   New Patient (Initial Visit)    HPI  Frederick Alvarez is a 57 y.o. Caucasian male who presents to the clinic for evaluation of history of TIA at the request of Turmel, Caleb P, PA. His past medical history and cardiovascular risk factors include: Hypertension, history of TIA, alcohol abuse, aortic atherosclerosis, cigarette smoker.   Patient was recently seen in [facility] in March 2024 with symptoms concerning for shortness of breath, dizziness, trouble speaking, left-sided weakness.  He was diagnosed with TIA  at the time of discharge and now referred to cardiology for further evaluation and management.  During the hospitalization he had an MRI of the brain, CTA of the head and neck, VQ scan, and echocardiogram results reviewed and noted below.  Denies anginal chest pain or heart failure symptoms.  He does have residual blurred vision likely from his recent TIA.  Drinks about 6-8 beers per day and 1 pack per day of cigarette.   ALLERGIES: No Known Allergies  MEDICATION LIST PRIOR TO VISIT: Current Meds  Medication Sig   amLODipine (NORVASC) 5 MG tablet Take 5 mg by mouth daily.   aspirin EC 81 MG tablet Take 1 tablet (81 mg total) by mouth daily. Swallow whole.   folic acid (FOLVITE) 1 MG tablet Take 1 tablet (1 mg total) by mouth daily.   olmesartan (BENICAR) 40 MG tablet Take 40 mg by mouth daily.   rosuvastatin (CRESTOR) 5 MG tablet Take 5 mg by mouth daily.   thiamine (VITAMIN B1) 100 MG tablet Take 1 tablet (100 mg total) by mouth daily.     PAST MEDICAL HISTORY: Past Medical History:  Diagnosis Date    Hyperlipidemia    Hypertension    TIA (transient ischemic attack)     PAST SURGICAL HISTORY: History reviewed. No pertinent surgical history.  FAMILY HISTORY: The patient family history includes Healthy in his mother; Heart attack in his father.  SOCIAL HISTORY:  The patient  reports that he has been smoking cigarettes. He has never used smokeless tobacco. He reports that he does not currently use alcohol. He reports that he does not use drugs.  REVIEW OF SYSTEMS: Review of Systems  Eyes:  Positive for blurred vision.  Cardiovascular:  Negative for chest pain, claudication, dyspnea on exertion, irregular heartbeat, leg swelling, near-syncope, orthopnea, palpitations, paroxysmal nocturnal dyspnea and syncope.  Respiratory:  Negative for shortness of breath.   Hematologic/Lymphatic: Negative for bleeding problem.  Musculoskeletal:  Negative for muscle cramps and myalgias.  Neurological:  Negative for dizziness and light-headedness.    PHYSICAL EXAM:    05/06/2022   12:40 PM 04/17/2022    3:00 PM 04/17/2022   11:00 AM  Vitals with BMI  Height 5\' 10"     Weight 114 lbs    BMI [id number]    Systolic [id number]  Diastolic 83 78 74  Pulse 95 60 60    Physical Exam  Constitutional: No distress.  Age appropriate, hemodynamically stable.   Neck: No JVD present.  Cardiovascular: Normal rate, regular rhythm, S1 normal, S2 normal and intact distal pulses. Exam reveals no gallop,  no S3 and no S4.  No murmur heard. Pulses:      Dorsalis pedis pulses are 1+ on the right side and 1+ on the left side.       Posterior tibial pulses are 1+ on the right side and 1+ on the left side.  Pulmonary/Chest: Effort normal and breath sounds normal. No stridor. He has no wheezes. He has no rales.  Abdominal: Soft. Bowel sounds are normal. He exhibits no distension. There is no abdominal tenderness.  Musculoskeletal:        General: No edema.     Cervical back: Neck supple.  Neurological: He is alert and  oriented to person, place, and time. He has intact cranial nerves (2-12).  Skin: Skin is warm and moist.   CARDIAC DATABASE: EKG: 05/06/2022: Sinus rhythm, 87 bpm, RAE, without underlying injury pattern.  Echocardiogram: 04/17/2022:  1. Left ventricular ejection fraction, by estimation, is 60 to 65%. The  left ventricle has normal function. The left ventricle has no regional  wall motion abnormalities. Left ventricular diastolic parameters were  normal.   2. Right ventricular systolic function is normal. The right ventricular  size is normal. There is normal pulmonary artery systolic pressure. The  estimated right ventricular systolic pressure is [id number] mmHg.   3. The mitral valve is normal in structure. Mild mitral valve  regurgitation. No evidence of mitral stenosis.   4. Tricuspid valve regurgitation is mild to moderate.   5. The aortic valve is normal in structure. Aortic valve regurgitation is  not visualized. No aortic stenosis is present.   6. The inferior vena cava is normal in size with greater than 50%  respiratory variability, suggesting right atrial pressure of 3 mmHg.   Stress Testing: No results found for this or any previous visit from the past 1095 days.   Heart Catheterization: None  CTA head and neck March 2024: 1. No large vessel occlusion or proximal hemodynamically significant stenosis. 2. No evidence of core infarct or penumbra on CT perfusion. 3. Bilateral carotid bifurcation atherosclerosis without greater than 50% stenosis. 4. Approximately 6 mm pulmonary nodule at the left lung apex. Non-contrast chest CT at 6-12 months is recommended. If the nodule is stable at time of repeat CT, then future CT at 18-24 months (from today's scan) is considered optional for low-risk patients, but is recommended for high-risk patients. This recommendation follows the consensus statement: Guidelines for Management of Incidental Pulmonary Nodules Detected on CT Images:  From the Fleischner Society 2017; Radiology 2017; 284:228-243. 5. Aortic Atherosclerosis (ICD10-I70.0) and Emphysema (ICD10-J43.9).  MRI of the brain with and without contrast: 04/16/2022 1. No acute intracranial process. 2. Small air-fluid level in the left maxillary sinus, which is nonspecific but can be seen in the setting of acute sinusitis.   LABORATORY DATA:    Latest Ref Rng & Units 04/16/2022   11:30 PM 04/16/2022    3:06 PM 03/21/2022    4:01 PM  CBC  WBC 4.0 - 10.5 K/uL 4.1  5.1  4.9   Hemoglobin 13.0 - 17.0 g/dL 13.2  13.9  13.1   Hematocrit 39.0 - 52.0 % 38.2  40.0  37.0   Platelets 150 - 400 K/uL 216  248  268        Latest Ref Rng & Units 04/16/2022   11:30 PM 04/16/2022    3:06 PM 03/21/2022    4:01 PM  CMP  Glucose 70 - 99 mg/dL  116  116   BUN 6 -  20 mg/dL  5  <5   Creatinine 0.61 - 1.24 mg/dL 0.55  0.64  0.64   Sodium 135 - 145 mmol/L  132  127   Potassium 3.5 - 5.1 mmol/L  3.6  3.6   Chloride 98 - 111 mmol/L  97  92   CO2 22 - 32 mmol/L  25  24   Calcium 8.9 - 10.3 mg/dL  9.0  8.6   Total Protein 6.5 - 8.1 g/dL  7.7    Total Bilirubin 0.3 - 1.2 mg/dL  0.8    Alkaline Phos 38 - 126 U/L  79    AST 15 - 41 U/L  30    ALT 0 - 44 U/L  21      Lipid Panel     Component Value Date/Time   CHOL 197 04/17/2022 0445   TRIG 76 04/17/2022 0445   HDL 116 04/17/2022 0445   CHOLHDL 1.7 04/17/2022 0445   VLDL 15 04/17/2022 0445   LDLCALC 66 04/17/2022 0445    No components found for: "NTPROBNP" No results for input(s): "PROBNP" in the last 8760 hours. Recent Labs    04/17/22 0445  TSH 3.810  3.660    BMP Recent Labs    03/21/22 1601 04/16/22 1506 04/16/22 2330  NA 127* 132*  --   K 3.6 3.6  --   CL 92* 97*  --   CO2 24 25  --   GLUCOSE 116* 116*  --   BUN <5* 5*  --   CREATININE 0.64 0.64 0.55*  CALCIUM 8.6* 9.0  --   GFRNONAA >60 >60 >60    HEMOGLOBIN A1C Lab Results  Component Value Date   HGBA1C 4.9 04/17/2022   MPG 94 Q000111Q   Comp  Metabolic Panel Reviewed 123456 08:14:02 PM Interpretation:BS 126, GFR 111 Performing Lab: Notes/Report: Testing performed at: [BN] [facility], [address] [place], [place], [address], Phone: [phone], Laboratory Director: Rush Farmer, MD  Glucose 126 70-99 mg/dL    BUN 5 6-24 mg/dL    Creatinine 0.62 0.76-1.27 mg/dL    eGFR 111 >59 mL/min/1.73    Sodium 139 134-144 mmol/L    Potassium 4.0 3.5-5.2 mmol/L    Chloride 99 96-106 mmol/L    Carbon Dioxide, Total 24 20-29 mmol/L    Calcium 9.7 8.7-10.2 mg/dL    CA-corrected 9.30 8.60-10.30 mg/dL    Protein, Total 7.0 6.0-8.5 g/dL    Albumin 4.5 3.8-4.9 g/dL    Bilirubin, Total 0.3 0.0-1.2 mg/dL    Alkaline Phosphatase 86 44-121 IU/L    AST (SGOT) 23 0-40 IU/L    ALT (SGPT) 15 0-44 IU/L    Lipid Panel w/reflex Reviewed date:04/23/2022 08:14:32 PM Interpretation:HDL 101, LDL 97 Performing Lab: Notes/Report: Testing performed at: [BN] [facility], [address] [place], [place], [address], Phone: [phone], Laboratory Director: Rush Farmer, MD  Cholesterol, Total 218 100-199 mg/dL    CHOL/HDL 2.2 2.0-4.0 Ratio    HDL Cholesterol 101 >39 mg/dL    Triglycerides 119 0-149 mg/dL    NHDL 117 0-129 mg/dL Range dependent upon risk factors.  LDL Chol Calc (NIH) 97 0-99 mg/dL      IMPRESSION:    ICD-10-CM   1. TIA (transient ischemic attack)  G45.9 EKG 12-Lead    LONG TERM MONITOR (3-14 DAYS)    CT CARDIAC SCORING (SELF PAY ONLY)    2. Atherosclerosis of both carotid arteries  I65.23 PCV CAROTID DUPLEX (BILATERAL)    3. Benign hypertension  I10  4. Mixed hyperlipidemia  E78.2     5. Alcohol abuse  F10.10     6. Cigarette smoker  F17.210        RECOMMENDATIONS: Frederick Alvarez is a 57 y.o. Caucasian male whose past medical history and cardiac risk factors include: Hypertension, history of TIA, alcohol abuse, aortic atherosclerosis, cigarette smoker.   TIA (transient ischemic  attack) Index event monitor 2024 Continue aspirin 81 mg p.o. daily. Continue statin therapy. 2-week extended Holter monitor to evaluate for A-fib burden and/or dysrhythmias. If the monitor is unremarkable patient may consider a loop recorder implant for long-term monitoring of A-fib.  Discussed the risks, complications, and alternatives for loop recorder implant.  Patient to consider this in the interim. Echocardiogram independently reviewed. A bubble study was not performed and given his TIA evaluating for PFO is indicated.  We discussed undergoing transesophageal echocardiogram however, he would like to think this over and will discuss at the next visit.  At the very minimal we will recommend limited echo with bubbles and/or transcranial Doppler.  Atherosclerosis of both carotid arteries Noted during the TIA workup. Scheduled for carotid duplex in 1 year. Continue aspirin and statin therapy  Benign hypertension Educated him on the importance of blood pressure monitoring. Reemphasized importance of low-salt diet. Currently managed by primary care provider.  Mixed hyperlipidemia Currently on rosuvastatin. Managed by PCP  Alcohol abuse Reemphasized importance of complete alcohol cessation. Alternative could be no more than 2 standard alcoholic beverages per day  Cigarette smoker Tobacco cessation counseling: Currently smoking 1 packs/day   Patient was informed of the dangers of tobacco abuse including stroke, cancer, and MI, as well as benefits of tobacco cessation. Patient is willing to quit at this time. 7 mins were spent counseling patient cessation techniques. We discussed various methods to help quit smoking, including deciding on a date to quit, joining a support group, pharmacological agents- nicotine gum/patch/lozenges.  I will reassess his progress at the next follow-up visit  Data Reviewed: I have independently reviewed external notes provided by the referring provider as  part of this office visit.   I have independently reviewed results of Labs, EKG, echocardiogram, CT scan results, MRI results, VQ scan results as part of medical decision making. I have ordered the following tests:  Orders Placed This Encounter  Procedures   CT CARDIAC SCORING (SELF PAY ONLY)    Standing Status:   Future    Standing Expiration Date:   05/06/2023    Order Specific Question:   Preferred imaging location?    Answer:   External   LONG TERM MONITOR (3-14 DAYS)    Standing Status:   Future    Standing Expiration Date:   05/06/2023    Order Specific Question:   Where should this test be performed?    Answer:   [facility]    Order Specific Question:   Does the patient have an implanted cardiac device?    Answer:   No    Order Specific Question:   Prescribed days of wear    Answer:   65    Order Specific Question:   Type of enrollment    Answer:   Clinic Enrollment   EKG 12-Lead   I have not made medications changes at today's encounter as noted above.  FINAL MEDICATION LIST END OF ENCOUNTER: No orders of the defined types were placed in this encounter.   Medications Discontinued During This Encounter  Medication Reason   promethazine-dextromethorphan (PROMETHAZINE-DM) 6.25-15 MG/5ML syrup  cyanocobalamin 1000 MCG tablet      Current Outpatient Medications:    amLODipine (NORVASC) 5 MG tablet, Take 5 mg by mouth daily., Disp: , Rfl:    aspirin EC 81 MG tablet, Take 1 tablet (81 mg total) by mouth daily. Swallow whole., Disp: 30 tablet, Rfl: 0   folic acid (FOLVITE) 1 MG tablet, Take 1 tablet (1 mg total) by mouth daily., Disp: 90 tablet, Rfl: 3   olmesartan (BENICAR) 40 MG tablet, Take 40 mg by mouth daily., Disp: , Rfl:    rosuvastatin (CRESTOR) 5 MG tablet, Take 5 mg by mouth daily., Disp: , Rfl:    thiamine (VITAMIN B1) 100 MG tablet, Take 1 tablet (100 mg total) by mouth daily., Disp: 30 tablet, Rfl: 2  Orders Placed This Encounter  Procedures   CT  CARDIAC SCORING (SELF PAY ONLY)   LONG TERM MONITOR (3-14 DAYS)   EKG 12-Lead   [facility] CAROTID DUPLEX (BILATERAL)    There are no Patient Instructions on file for this visit.   --Continue cardiac medications as reconciled in final medication list. --Return in about 6 weeks (around 06/17/2022) for Follow up for s/p TIA workup . or sooner if needed. --Continue follow-up with your primary care physician regarding the management of your other chronic comorbid conditions.  Patient's questions and concerns were addressed to his satisfaction. He voices understanding of the instructions provided during this encounter.   This note was created using a voice recognition software as a result there may be grammatical errors inadvertently enclosed that do not reflect the nature of this encounter. Every attempt is made to correct such errors.  Rex Kras, [place], [facility]  Pager:  [phone] Office: [phone]

## 2022-05-19 ENCOUNTER — Ambulatory Visit: Payer: Managed Care, Other (non HMO)

## 2022-05-19 DIAGNOSIS — I6523 Occlusion and stenosis of bilateral carotid arteries: Secondary | ICD-10-CM

## 2022-05-26 ENCOUNTER — Other Ambulatory Visit: Payer: Managed Care, Other (non HMO)

## 2022-06-03 ENCOUNTER — Ambulatory Visit (HOSPITAL_COMMUNITY)
Admission: RE | Admit: 2022-06-03 | Discharge: 2022-06-03 | Disposition: A | Discharge status: Home / Self Care | Payer: Managed Care, Other (non HMO) | Source: Ambulatory Visit | Attending: Cardiology | Admitting: Cardiology

## 2022-06-03 DIAGNOSIS — G459 Transient cerebral ischemic attack, unspecified: Secondary | ICD-10-CM | POA: Insufficient documentation

## 2022-06-09 ENCOUNTER — Ambulatory Visit: Payer: Managed Care, Other (non HMO) | Admitting: Cardiology

## 2022-06-11 NOTE — Progress Notes (Signed)
LMTCB

## 2022-06-12 NOTE — Progress Notes (Signed)
Called patient to inform him about his monitor. Patient understood

## 2022-06-17 ENCOUNTER — Encounter: Payer: Self-pay | Admitting: Cardiology

## 2022-06-17 ENCOUNTER — Ambulatory Visit: Payer: Managed Care, Other (non HMO) | Admitting: Cardiology

## 2022-06-17 VITALS — BP 116/71 | HR 85 | Resp 16 | Ht 70.0 in | Wt 118.8 lb

## 2022-06-17 DIAGNOSIS — E782 Mixed hyperlipidemia: Secondary | ICD-10-CM

## 2022-06-17 DIAGNOSIS — F17210 Nicotine dependence, cigarettes, uncomplicated: Secondary | ICD-10-CM

## 2022-06-17 DIAGNOSIS — I2510 Atherosclerotic heart disease of native coronary artery without angina pectoris: Secondary | ICD-10-CM

## 2022-06-17 DIAGNOSIS — F1010 Alcohol abuse, uncomplicated: Secondary | ICD-10-CM

## 2022-06-17 DIAGNOSIS — R931 Abnormal findings on diagnostic imaging of heart and coronary circulation: Secondary | ICD-10-CM

## 2022-06-17 DIAGNOSIS — G459 Transient cerebral ischemic attack, unspecified: Secondary | ICD-10-CM

## 2022-06-17 DIAGNOSIS — I10 Essential (primary) hypertension: Secondary | ICD-10-CM

## 2022-06-17 MED ORDER — ROSUVASTATIN CALCIUM 10 MG PO TABS: 10.0000 mg | ORAL_TABLET | Freq: Every day | ORAL | 0 refills | Status: DC

## 2022-06-17 NOTE — Progress Notes (Unsigned)
ID:  Frederick Alvarez, DOB [birth year], MRN [id number]  PCP:  Johny Blamer, MD  Cardiologist:  Tessa Lerner, DO, [facility] (established care 05/06/2022)  Date: 06/17/22 Last Office Visit: 05/06/2022  Chief Complaint  Patient presents with   Transient Ischemic Attack   Follow-up    HPI  Frederick Alvarez is a 57 y.o. Caucasian male whose past medical history and cardiovascular risk factors include: Hypertension, history of TIA, alcohol abuse, aortic atherosclerosis, cigarette smoker.   In March 2024 patient presented to [facility], ED with symptoms of dizziness, trouble speaking, left-sided weakness based on the workup he was diagnosed with TIA at the time of discharge.  He was referred to cardiology for further evaluation and management.  At last office visit the shared decision was to proceed with a carotid duplex, coronary calcium score, and a Zio patch.  Results discussed with him in detail and noted below for further reference.   ***  Meledy sister  3-4 beer <1ppd  Drinks about 6-8 beers per day and 1 pack per day of cigarette.   ALLERGIES: No Known Allergies  MEDICATION LIST PRIOR TO VISIT: Current Meds  Medication Sig   amLODipine (NORVASC) 5 MG tablet Take 5 mg by mouth daily.   aspirin EC 81 MG tablet Take 1 tablet (81 mg total) by mouth daily. Swallow whole.   folic acid (FOLVITE) 1 MG tablet Take 1 tablet (1 mg total) by mouth daily.   olmesartan (BENICAR) 40 MG tablet Take 40 mg by mouth daily.   rosuvastatin (CRESTOR) 5 MG tablet Take 5 mg by mouth daily.   thiamine (VITAMIN B1) 100 MG tablet Take 1 tablet (100 mg total) by mouth daily.     PAST MEDICAL HISTORY: Past Medical History:  Diagnosis Date   Hyperlipidemia    Hypertension    TIA (transient ischemic attack)     PAST SURGICAL HISTORY: History reviewed. No pertinent surgical history.  FAMILY HISTORY: The patient family history includes Healthy in his mother; Heart attack in his father.  SOCIAL  HISTORY:  The patient  reports that he has been smoking cigarettes. He has never used smokeless tobacco. He reports that he does not currently use alcohol. He reports that he does not use drugs.  REVIEW OF SYSTEMS: Review of Systems  Eyes:  Positive for blurred vision.  Cardiovascular:  Negative for chest pain, claudication, dyspnea on exertion, irregular heartbeat, leg swelling, near-syncope, orthopnea, palpitations, paroxysmal nocturnal dyspnea and syncope.  Respiratory:  Negative for shortness of breath.   Hematologic/Lymphatic: Negative for bleeding problem.  Musculoskeletal:  Negative for muscle cramps and myalgias.  Neurological:  Negative for dizziness and light-headedness.    PHYSICAL EXAM:    06/17/2022    4:03 PM 05/06/2022   12:40 PM 04/17/2022    3:00 PM  Vitals with BMI  Height 5\' 10"  5\' 10"    Weight 118 lbs 13 oz 114 lbs   BMI 17.05 16.36   Systolic 116 136 409  Diastolic 71 83 78  Pulse 85 95 60    Physical Exam  Constitutional: No distress.  Age appropriate, hemodynamically stable.   Neck: No JVD present.  Cardiovascular: Normal rate, regular rhythm, S1 normal, S2 normal and intact distal pulses. Exam reveals no gallop, no S3 and no S4.  No murmur heard. Pulses:      Dorsalis pedis pulses are 1+ on the right side and 1+ on the left side.       Posterior tibial pulses are 1+  on the right side and 1+ on the left side.  Pulmonary/Chest: Effort normal and breath sounds normal. No stridor. He has no wheezes. He has no rales.  Abdominal: Soft. Bowel sounds are normal. He exhibits no distension. There is no abdominal tenderness.  Musculoskeletal:        General: No edema.     Cervical back: Neck supple.  Neurological: He is alert and oriented to person, place, and time. He has intact cranial nerves (2-12).  Skin: Skin is warm and moist.   CARDIAC DATABASE: EKG: 05/06/2022: Sinus rhythm, 87 bpm, RAE, without underlying injury pattern.  Echocardiogram: 04/17/2022:   1. Left ventricular ejection fraction, by estimation, is 60 to 65%. The  left ventricle has normal function. The left ventricle has no regional  wall motion abnormalities. Left ventricular diastolic parameters were  normal.   2. Right ventricular systolic function is normal. The right ventricular  size is normal. There is normal pulmonary artery systolic pressure. The  estimated right ventricular systolic pressure is 33.5 mmHg.   3. The mitral valve is normal in structure. Mild mitral valve  regurgitation. No evidence of mitral stenosis.   4. Tricuspid valve regurgitation is mild to moderate.   5. The aortic valve is normal in structure. Aortic valve regurgitation is  not visualized. No aortic stenosis is present.   6. The inferior vena cava is normal in size with greater than 50%  respiratory variability, suggesting right atrial pressure of 3 mmHg.   Stress Testing: No results found for this or any previous visit from the past 1095 days.   Heart Catheterization: None  Cardiac monitor (Zio Patch): May 19, 2022 -June 01, 2022 Dominant rhythm sinus.  Heart rate 53-200 bpm. Avg HR 75 bpm. No atrial fibrillation, ventricular tachycardia, high grade AV block, pauses (3 seconds or longer). Total ventricular ectopic burden <1%. Episodes of paroxysmal supraventricular tachycardia.  Fastest episode was 9 beats, 2.8 seconds, max heart rate 200 bpm on 05/31/2022 at 12:25 PM.  Longest SVT episode 22 beats, 14.2 seconds, max heart rate 105 bpm, likely atrial tachycardia.  Total supraventricular ectopic burden <1%. Patient triggered events: 0.   Carotid artery duplex 05/19/2022: Duplex suggests stenosis in the right internal carotid artery (1-15%). No evidence of significant stenosis in the left carotid vessels. There is moderate heterogeneous plaque noted in the bilateral carotid bulb without hemodynamically significant stenosis. Antegrade right vertebral artery flow. Antegrade left  vertebral artery flow   CTA head and neck March 2024: 1. No large vessel occlusion or proximal hemodynamically significant stenosis. 2. No evidence of core infarct or penumbra on CT perfusion. 3. Bilateral carotid bifurcation atherosclerosis without greater than 50% stenosis. 4. Approximately 6 mm pulmonary nodule at the left lung apex. Non-contrast chest CT at 6-12 months is recommended. If the noduleis stable at time of repeat CT, then future CT at 18-24 months (from today's scan) is considered optional for low-risk patients, but is recommended for high-risk patients. This recommendation follows the consensus statement: Guidelines for Management of Incidental Pulmonary Nodules Detected on CT Images: From the Fleischner Society 2017; Radiology 2017; 284:228-243. 5. Aortic Atherosclerosis (ICD10-I70.0) and Emphysema (ICD10-J43.9).  MRI of the brain with and without contrast: 04/16/2022 1. No acute intracranial process. 2. Small air-fluid level in the left maxillary sinus, which is nonspecific but can be seen in the setting of acute sinusitis.   LABORATORY DATA:    Latest Ref Rng & Units 04/16/2022   11:30 PM 04/16/2022    3:06 PM  03/21/2022    4:01 PM  CBC  WBC 4.0 - 10.5 K/uL 4.1  5.1  4.9   Hemoglobin 13.0 - 17.0 g/dL 09.8  11.9  14.7   Hematocrit 39.0 - 52.0 % 38.2  40.0  37.0   Platelets 150 - 400 K/uL 216  248  268        Latest Ref Rng & Units 04/16/2022   11:30 PM 04/16/2022    3:06 PM 03/21/2022    4:01 PM  CMP  Glucose 70 - 99 mg/dL  829  562   BUN 6 - 20 mg/dL  5  <5   Creatinine 1.30 - 1.24 mg/dL 8.65  7.84  6.96   Sodium 135 - 145 mmol/L  132  127   Potassium 3.5 - 5.1 mmol/L  3.6  3.6   Chloride 98 - 111 mmol/L  97  92   CO2 22 - 32 mmol/L  25  24   Calcium 8.9 - 10.3 mg/dL  9.0  8.6   Total Protein 6.5 - 8.1 g/dL  7.7    Total Bilirubin 0.3 - 1.2 mg/dL  0.8    Alkaline Phos 38 - 126 U/L  79    AST 15 - 41 U/L  30    ALT 0 - 44 U/L  21      Lipid Panel      Component Value Date/Time   CHOL 197 04/17/2022 0445   TRIG 76 04/17/2022 0445   HDL 116 04/17/2022 0445   CHOLHDL 1.7 04/17/2022 0445   VLDL 15 04/17/2022 0445   LDLCALC 66 04/17/2022 0445    No components found for: "NTPROBNP" No results for input(s): "PROBNP" in the last 8760 hours. Recent Labs    04/17/22 0445  TSH 3.810  3.660     BMP Recent Labs    03/21/22 1601 04/16/22 1506 04/16/22 2330  NA 127* 132*  --   K 3.6 3.6  --   CL 92* 97*  --   CO2 24 25  --   GLUCOSE 116* 116*  --   BUN <5* 5*  --   CREATININE 0.64 0.64 0.55*  CALCIUM 8.6* 9.0  --   GFRNONAA >60 >60 >60     HEMOGLOBIN A1C Lab Results  Component Value Date   HGBA1C 4.9 04/17/2022   MPG 94 04/17/2022   Comp Metabolic Panel Reviewed date:04/23/2022 08:14:02 PM Interpretation:BS 126, GFR 111 Performing Lab: Notes/Report: Testing performed at: [BN] [facility], [address] [place], [place], [address], Phone: [phone], Laboratory Director: Jolene Schimke, MD  Glucose 126 70-99 mg/dL    BUN 5 6-64 mg/dL    Creatinine 4.03 4.74-2.59 mg/dL    eGFR 563 >87 FI/EPP/2.95    Sodium 139 134-144 mmol/L    Potassium 4.0 3.5-5.2 mmol/L    Chloride 99 96-106 mmol/L    Carbon Dioxide, Total 24 20-29 mmol/L    Calcium 9.7 8.7-10.2 mg/dL    CA-corrected 1.88 4.16-60.63 mg/dL    Protein, Total 7.0 0.1-6.0 g/dL    Albumin 4.5 1.0-9.3 g/dL    Bilirubin, Total 0.3 0.0-1.2 mg/dL    Alkaline Phosphatase 86 44-121 IU/L    AST (SGOT) 23 0-40 IU/L    ALT (SGPT) 15 0-44 IU/L    Lipid Panel w/reflex Reviewed date:04/23/2022 08:14:32 PM Interpretation:HDL 101, LDL 97 Performing Lab: Notes/Report: Testing performed at: [BN] [facility], [address] [place], [place], [address], Phone: [phone], Laboratory Director: Jolene Schimke, MD  Cholesterol, Total 218 100-199 mg/dL  CHOL/HDL 2.2 2.0-4.0 Ratio    HDL Cholesterol 101 >39 mg/dL    Triglycerides 409 8-119 mg/dL    NHDL  147 8-295 mg/dL Range dependent upon risk factors.  LDL Chol Calc (NIH) 97 0-99 mg/dL      IMPRESSION:    AOZ-30-QM   1. TIA (transient ischemic attack)  G45.9 EKG 12-Lead       RECOMMENDATIONS: Frederick Alvarez is a 57 y.o. Caucasian male whose past medical history and cardiac risk factors include: Hypertension, history of TIA, alcohol abuse, aortic atherosclerosis, cigarette smoker.   TIA (transient ischemic attack) Index event monitor 2024 Continue aspirin 81 mg p.o. daily. Continue statin therapy. 2-week extended Holter monitor to evaluate for A-fib burden and/or dysrhythmias. If the monitor is unremarkable patient may consider a loop recorder implant for long-term monitoring of A-fib.  Discussed the risks, complications, and alternatives for loop recorder implant.  Patient to consider this in the interim. Echocardiogram independently reviewed. A bubble study was not performed and given his TIA evaluating for PFO is indicated.  We discussed undergoing transesophageal echocardiogram however, he would like to think this over and will discuss at the next visit.  At the very minimal we will recommend limited echo with bubbles and/or transcranial Doppler.  Atherosclerosis of both carotid arteries Noted during the TIA workup. Scheduled for carotid duplex in 1 year. Continue aspirin and statin therapy  Benign hypertension Educated him on the importance of blood pressure monitoring. Reemphasized importance of low-salt diet. Currently managed by primary care provider.  Mixed hyperlipidemia Currently on rosuvastatin. Managed by PCP  Alcohol abuse Reemphasized importance of complete alcohol cessation. Alternative could be no more than 2 standard alcoholic beverages per day  Cigarette smoker Tobacco cessation counseling: Currently smoking 1 packs/day   Patient was informed of the dangers of tobacco abuse including stroke, cancer, and MI, as well as benefits of tobacco  cessation. Patient is willing to quit at this time. 7 mins were spent counseling patient cessation techniques. We discussed various methods to help quit smoking, including deciding on a date to quit, joining a support group, pharmacological agents- nicotine gum/patch/lozenges.  I will reassess his progress at the next follow-up visit  Orders Placed This Encounter  Procedures   EKG 12-Lead   I have not made medications changes at today's encounter as noted above.  FINAL MEDICATION LIST END OF ENCOUNTER: No orders of the defined types were placed in this encounter.   There are no discontinued medications.    Current Outpatient Medications:    amLODipine (NORVASC) 5 MG tablet, Take 5 mg by mouth daily., Disp: , Rfl:    aspirin EC 81 MG tablet, Take 1 tablet (81 mg total) by mouth daily. Swallow whole., Disp: 30 tablet, Rfl: 0   folic acid (FOLVITE) 1 MG tablet, Take 1 tablet (1 mg total) by mouth daily., Disp: 90 tablet, Rfl: 3   olmesartan (BENICAR) 40 MG tablet, Take 40 mg by mouth daily., Disp: , Rfl:    rosuvastatin (CRESTOR) 5 MG tablet, Take 5 mg by mouth daily., Disp: , Rfl:    thiamine (VITAMIN B1) 100 MG tablet, Take 1 tablet (100 mg total) by mouth daily., Disp: 30 tablet, Rfl: 2  Orders Placed This Encounter  Procedures   EKG 12-Lead    There are no Patient Instructions on file for this visit.   --Continue cardiac medications as reconciled in final medication list. --No follow-ups on file. or sooner if needed. --Continue follow-up with your primary care  physician regarding the management of your other chronic comorbid conditions.  Patient's questions and concerns were addressed to his satisfaction. He voices understanding of the instructions provided during this encounter.   This note was created using a voice recognition software as a result there may be grammatical errors inadvertently enclosed that do not reflect the nature of this encounter. Every attempt is made to  correct such errors.  Tessa Lerner, [place], [facility]  Pager:  [phone] Office: [phone]

## 2022-06-18 ENCOUNTER — Encounter: Payer: Self-pay | Admitting: Cardiology

## 2022-06-23 ENCOUNTER — Other Ambulatory Visit: Payer: Self-pay | Admitting: Cardiology

## 2022-06-24 LAB — COMP. METABOLIC PANEL (12)
AST: 18 IU/L (ref 0–40)
Albumin/Globulin Ratio: 2 (ref 1.2–2.2)
Albumin: 4.7 g/dL (ref 3.8–4.9)
Alkaline Phosphatase: 75 IU/L (ref 44–121)
BUN/Creatinine Ratio: 7 — ABNORMAL LOW (ref 9–20)
BUN: 5 mg/dL — ABNORMAL LOW (ref 6–24)
Bilirubin Total: 0.4 mg/dL (ref 0.0–1.2)
Calcium: 9.5 mg/dL (ref 8.7–10.2)
Chloride: 97 mmol/L (ref 96–106)
Creatinine, Ser: 0.73 mg/dL — ABNORMAL LOW (ref 0.76–1.27)
Globulin, Total: 2.3 g/dL (ref 1.5–4.5)
Glucose: 123 mg/dL — ABNORMAL HIGH (ref 70–99)
Potassium: 4.1 mmol/L (ref 3.5–5.2)
Sodium: 136 mmol/L (ref 134–144)
Total Protein: 7 g/dL (ref 6.0–8.5)
eGFR: 105 mL/min/{1.73_m2} (ref >59)

## 2022-06-24 LAB — LDL CHOLESTEROL, DIRECT: LDL Direct: 81 mg/dL (ref 0–99)

## 2022-06-24 LAB — LIPID PANEL WITH LDL/HDL RATIO
Cholesterol, Total: 194 mg/dL (ref 100–199)
HDL: 97 mg/dL (ref >39)
LDL Chol Calc (NIH): 84 mg/dL (ref 0–99)
LDL/HDL Ratio: 0.9 ratio (ref 0.0–3.6)
Triglycerides: 71 mg/dL (ref 0–149)
VLDL Cholesterol Cal: 13 mg/dL (ref 5–40)

## 2022-06-26 ENCOUNTER — Ambulatory Visit: Payer: Managed Care, Other (non HMO)

## 2022-06-26 DIAGNOSIS — I2510 Atherosclerotic heart disease of native coronary artery without angina pectoris: Secondary | ICD-10-CM

## 2022-06-26 DIAGNOSIS — R931 Abnormal findings on diagnostic imaging of heart and coronary circulation: Secondary | ICD-10-CM

## 2022-06-26 NOTE — Progress Notes (Signed)
Called patient, Frederick Alvarez

## 2022-06-26 NOTE — Progress Notes (Signed)
Gave patient this information and let him know it would be reviewed in more detail at next appt.

## 2022-06-30 ENCOUNTER — Telehealth: Payer: Self-pay

## 2022-06-30 NOTE — Telephone Encounter (Signed)
-----   Message from [place], [place] sent at 06/29/2022 10:29 PM EDT ----- Results reviewed. Serum potassium levels are within normal limits. Kidney function is relatively at baseline. LDL levels improving but not at goal Continue current medical therapy. Call if questions arise.  Sunit [place], DO, [facility]

## 2022-06-30 NOTE — Telephone Encounter (Signed)
Gave patient results he verbalized understanding

## 2022-07-04 NOTE — Progress Notes (Signed)
Called patient to inform him about his stress test results. Patient understood.

## 2022-08-05 ENCOUNTER — Ambulatory Visit: Payer: Managed Care, Other (non HMO) | Admitting: Cardiology

## 2022-08-05 ENCOUNTER — Encounter: Payer: Self-pay | Admitting: Cardiology

## 2022-08-05 VITALS — BP 138/83 | HR 90 | Ht 70.0 in | Wt 112.2 lb

## 2022-08-05 DIAGNOSIS — F1010 Alcohol abuse, uncomplicated: Secondary | ICD-10-CM

## 2022-08-05 DIAGNOSIS — E782 Mixed hyperlipidemia: Secondary | ICD-10-CM

## 2022-08-05 DIAGNOSIS — I10 Essential (primary) hypertension: Secondary | ICD-10-CM

## 2022-08-05 DIAGNOSIS — G459 Transient cerebral ischemic attack, unspecified: Secondary | ICD-10-CM

## 2022-08-05 DIAGNOSIS — R931 Abnormal findings on diagnostic imaging of heart and coronary circulation: Secondary | ICD-10-CM

## 2022-08-05 DIAGNOSIS — F17210 Nicotine dependence, cigarettes, uncomplicated: Secondary | ICD-10-CM

## 2022-08-05 DIAGNOSIS — I2510 Atherosclerotic heart disease of native coronary artery without angina pectoris: Secondary | ICD-10-CM

## 2022-08-05 NOTE — Progress Notes (Signed)
ID:  Frederick Alvarez, DOB May 06, 1965, MRN [id number]  PCP:  Noberto Retort, MD  Cardiologist:  Tessa Lerner, DO, [facility] (established care 05/06/2022)  Date: 08/05/22 Last Office Visit: 06/17/2022  Chief Complaint  Patient presents with   Follow-up    Coronary calcification. Reviewed test results.    HPI  Frederick Alvarez is a 57 y.o. Caucasian male whose past medical history and cardiovascular risk factors include: Coronary artery calcification, hypertension, history of TIA, alcohol abuse, aortic atherosclerosis, cigarette smoker.   Patient is accompanied by his Sister Melody at today's office visit.  He provides verbal consent with having her present during today's encounter.  In March 2024 patient presented to [facility], ED with symptoms of dizziness, trouble speaking, left-sided weakness based on the workup he was diagnosed with TIA at the time of discharge.  He was referred to cardiology for further evaluation and management.  Since establishing care patient has undergone appropriate cardiovascular workup as outlined below.  At last office visit given his coronary artery calcification stress test was recommended and rosuvastatin was increased from 5 mg to 10 mg p.o. daily.  Patient presents today for follow-up.  Denies anginal chest pain or heart failure symptoms.  No reoccurrence of TIA symptoms.  Results of the stress test reviewed with the patient and sister and noted below for further reference.  Since last office visit patient has reduced the consumption of both alcohol and cigarette smoking.  He does not want to proceed with transesophageal echocardiogram to evaluate for PFO in the setting of TIA.  However is willing to undergo a limited echo study with bubbles and transcranial Doppler.  ALLERGIES: No Known Allergies  MEDICATION LIST PRIOR TO VISIT: Current Meds  Medication Sig   amLODipine (NORVASC) 5 MG tablet Take 5 mg by mouth daily.   aspirin EC 81 MG tablet Take 1  tablet (81 mg total) by mouth daily. Swallow whole.   folic acid (FOLVITE) 1 MG tablet Take 1 tablet (1 mg total) by mouth daily.   olmesartan (BENICAR) 40 MG tablet Take 40 mg by mouth daily.   rosuvastatin (CRESTOR) 10 MG tablet Take 1 tablet (10 mg total) by mouth at bedtime.     PAST MEDICAL HISTORY: Past Medical History:  Diagnosis Date   Atherosclerosis of aorta (HCC)    Coronary artery calcification    Hyperlipidemia    Hypertension    TIA (transient ischemic attack)     PAST SURGICAL HISTORY: History reviewed. No pertinent surgical history.  FAMILY HISTORY: The patient family history includes Healthy in his mother; Heart attack in his father.  SOCIAL HISTORY:  The patient  reports that he has been smoking cigarettes. He has been smoking an average of .5 packs per day. He has never used smokeless tobacco. He reports that he does not currently use alcohol. He reports that he does not use drugs.  REVIEW OF SYSTEMS: Review of Systems  Cardiovascular:  Negative for chest pain, claudication, dyspnea on exertion, irregular heartbeat, leg swelling, near-syncope, orthopnea, palpitations, paroxysmal nocturnal dyspnea and syncope.  Respiratory:  Negative for shortness of breath.   Hematologic/Lymphatic: Negative for bleeding problem.  Musculoskeletal:  Negative for muscle cramps and myalgias.  Neurological:  Negative for dizziness and light-headedness.    PHYSICAL EXAM:    08/05/2022   10:30 AM 06/17/2022    4:03 PM 05/06/2022   12:40 PM  Vitals with BMI  Height 5\' 10"  5\' 10"  5\' 10"   Weight 112 lbs 3  oz 118 lbs 13 oz 114 lbs  BMI 16.1 17.05 16.36  Systolic 138 116 161  Diastolic 83 71 83  Pulse 90 85 95    Physical Exam  Constitutional: No distress.  Age appropriate, hemodynamically stable.   Neck: No JVD present.  Cardiovascular: Normal rate, regular rhythm, S1 normal, S2 normal and intact distal pulses. Exam reveals no gallop, no S3 and no S4.  No murmur  heard. Pulses:      Dorsalis pedis pulses are 1+ on the right side and 1+ on the left side.       Posterior tibial pulses are 1+ on the right side and 1+ on the left side.  Pulmonary/Chest: Effort normal and breath sounds normal. No stridor. He has no wheezes. He has no rales.  Abdominal: Soft. Bowel sounds are normal. He exhibits no distension. There is no abdominal tenderness.  Musculoskeletal:        General: No edema.     Cervical back: Neck supple.  Neurological: He is alert and oriented to person, place, and time. He has intact cranial nerves (2-12).  Skin: Skin is warm and moist.   CARDIAC DATABASE: EKG: Jun 17, 2022: Sinus rhythm, 78 bpm, normal axis, poor R wave progression, without underlying ischemia injury pattern.  Echocardiogram: 04/17/2022: LVEF 60 to 65%, diastolic function normal, RVSP 33.5 mmHg, mild MR, mild to moderate TR, estimated RAP 3 mmHg.  Stress Testing: Regadenoson (with Mod Bruce protocol) Nuclear stress test 06/26/2022 Myocardial perfusion is normal. Overall LV systolic function is normal without regional wall motion abnormalities. Stress LV EF: 83%. Low risk study. Nondiagnostic ECG stress. The heart rate response was consistent with Regadenoson. The blood pressure response was physiologic. No previous exam available for comparison.    Heart Catheterization: None  Cardiac monitor (Zio Patch): May 19, 2022 -June 01, 2022 Dominant rhythm sinus.  Heart rate 53-200 bpm. Avg HR 75 bpm. No atrial fibrillation, ventricular tachycardia, high grade AV block, pauses (3 seconds or longer). Total ventricular ectopic burden <1%. Episodes of paroxysmal supraventricular tachycardia.  Fastest episode was 9 beats, 2.8 seconds, max heart rate 200 bpm on 05/31/2022 at 12:25 PM.  Longest SVT episode 22 beats, 14.2 seconds, max heart rate 105 bpm, likely atrial tachycardia.  Total supraventricular ectopic burden <1%. Patient triggered events: 0.   Carotid artery  duplex 05/19/2022: Duplex suggests stenosis in the right internal carotid artery (1-15%). No evidence of significant stenosis in the left carotid vessels. There is moderate heterogeneous plaque noted in the bilateral carotid bulb without hemodynamically significant stenosis. Antegrade right vertebral artery flow. Antegrade left vertebral artery flow   CT Cardiac Scoring: June 03, 2022 Coronary Calcium Score: Left main: 0 Left anterior descending artery: 858 Left circumflex artery: 0 Right coronary artery: 0  Total: 858 Percentile: 97th  Pericardium: Normal.  Ascending Aorta: Normal caliber.  Total CAC 858 AU, 97th percentile for patient's age, sex, and race. Noncardiac findings: No significant extracardiac findings within the visualized chest.   CTA head and neck March 2024: 1. No large vessel occlusion or proximal hemodynamically significant stenosis. 2. No evidence of core infarct or penumbra on CT perfusion. 3. Bilateral carotid bifurcation atherosclerosis without greater than 50% stenosis. 4. Approximately 6 mm pulmonary nodule at the left lung apex. Non-contrast chest CT at 6-12 months is recommended. If the noduleis stable at time of repeat CT, then future CT at 18-24 months (from today's scan) is considered optional for low-risk patients, but is recommended for high-risk patients. This  recommendation follows the consensus statement: Guidelines for Management of Incidental Pulmonary Nodules Detected on CT Images: From the Fleischner Society 2017; Radiology 2017; 284:228-243. 5. Aortic Atherosclerosis (ICD10-I70.0) and Emphysema (ICD10-J43.9).  MRI of the brain with and without contrast: 04/16/2022 1. No acute intracranial process. 2. Small air-fluid level in the left maxillary sinus, which is nonspecific but can be seen in the setting of acute sinusitis.   LABORATORY DATA:    Latest Ref Rng & Units 04/16/2022   11:30 PM 04/16/2022    3:06 PM 03/21/2022    4:01 PM  CBC   WBC 4.0 - 10.5 K/uL 4.1  5.1  4.9   Hemoglobin 13.0 - 17.0 g/dL 44.0  10.2  72.5   Hematocrit 39.0 - 52.0 % 38.2  40.0  37.0   Platelets 150 - 400 K/uL 216  248  268        Latest Ref Rng & Units 06/23/2022   12:27 PM 04/16/2022   11:30 PM 04/16/2022    3:06 PM  CMP  Glucose 70 - 99 mg/dL 366   440   BUN 6 - 24 mg/dL 5   5   Creatinine 3.47 - 1.27 mg/dL 4.25  9.56  3.87   Sodium 134 - 144 mmol/L 136   132   Potassium 3.5 - 5.2 mmol/L 4.1   3.6   Chloride 96 - 106 mmol/L 97   97   CO2 22 - 32 mmol/L   25   Calcium 8.7 - 10.2 mg/dL 9.5   9.0   Total Protein 6.0 - 8.5 g/dL 7.0   7.7   Total Bilirubin 0.0 - 1.2 mg/dL 0.4   0.8   Alkaline Phos 44 - 121 IU/L 75   79   AST 0 - 40 IU/L 18   30   ALT 0 - 44 U/L   21     Lipid Panel     Component Value Date/Time   CHOL 194 06/23/2022 1227   TRIG 71 06/23/2022 1227   HDL 97 06/23/2022 1227   CHOLHDL 1.7 04/17/2022 0445   VLDL 15 04/17/2022 0445   LDLCALC 84 06/23/2022 1227   LDLDIRECT 81 06/23/2022 1227   LABVLDL 13 06/23/2022 1227    No components found for: "NTPROBNP" No results for input(s): "PROBNP" in the last 8760 hours. Recent Labs    04/17/22 0445  TSH 3.810  3.660    BMP Recent Labs    03/21/22 1601 04/16/22 1506 04/16/22 2330 06/23/22 1227  NA 127* 132*  --  136  K 3.6 3.6  --  4.1  CL 92* 97*  --  97  CO2 24 25  --   --   GLUCOSE 116* 116*  --  123*  BUN <5* 5*  --  5*  CREATININE 0.64 0.64 0.55* 0.73*  CALCIUM 8.6* 9.0  --  9.5  GFRNONAA >60 >60 >60  --     HEMOGLOBIN A1C Lab Results  Component Value Date   HGBA1C 4.9 04/17/2022   MPG 94 04/17/2022   Comp Metabolic Panel Reviewed date:04/23/2022 08:14:02 PM Interpretation:BS 126, GFR 111 Performing Lab: Notes/Report: Testing performed at: [BN] [facility], [address], [place], [place], [address], Phone: [phone], Laboratory Director: Jolene Schimke, MD  Glucose 126 70-99 mg/dL    BUN 5 1-60 mg/dL    Creatinine 1.09  3.23-5.57 mg/dL    eGFR 322 >02 RK/YHC/6.23    Sodium 139 134-144 mmol/L    Potassium 4.0 3.5-5.2 mmol/L  Chloride 99 96-106 mmol/L    Carbon Dioxide, Total 24 20-29 mmol/L    Calcium 9.7 8.7-10.2 mg/dL    CA-corrected 1.61 0.96-04.54 mg/dL    Protein, Total 7.0 0.9-8.1 g/dL    Albumin 4.5 1.9-1.4 g/dL    Bilirubin, Total 0.3 0.0-1.2 mg/dL    Alkaline Phosphatase 86 44-121 IU/L    AST (SGOT) 23 0-40 IU/L    ALT (SGPT) 15 0-44 IU/L    Lipid Panel w/reflex Reviewed date:04/23/2022 08:14:32 PM Interpretation:HDL 101, LDL 97 Performing Lab: Notes/Report: Testing performed at: [BN] [facility], [address] [place], [place], [address], Phone: [phone], Laboratory Director: Jolene Schimke, MD  Cholesterol, Total 218 100-199 mg/dL    CHOL/HDL 2.2 2.9-5.2 Ratio    HDL Cholesterol 101 >39 mg/dL    Triglycerides 841 3-244 mg/dL    NHDL 010 2-725 mg/dL Range dependent upon risk factors.  LDL Chol Calc (NIH) 97 0-99 mg/dL      IMPRESSION:    [id number]-44-IH   1. Coronary atherosclerosis due to calcified coronary lesion  I25.10 Lipid Panel With LDL/HDL Ratio   I25.84 LDL cholesterol, direct    CMP14+EGFR    CMP14+EGFR    LDL cholesterol, direct    Lipid Panel With LDL/HDL Ratio    2. Agatston CAC score, >400  R93.1 Lipid Panel With LDL/HDL Ratio    LDL cholesterol, direct    CMP14+EGFR    CMP14+EGFR    LDL cholesterol, direct    Lipid Panel With LDL/HDL Ratio    3. TIA (transient ischemic attack)  G45.9 ECHOCARDIOGRAM LIMITED BUBBLE STUDY    VAS [place] TRANSCRANIAL DOPPLER W BUBBLES    4. Benign hypertension  I10     5. Mixed hyperlipidemia  E78.2 Lipid Panel With LDL/HDL Ratio    LDL cholesterol, direct    CMP14+EGFR    CMP14+EGFR    LDL cholesterol, direct    Lipid Panel With LDL/HDL Ratio    6. Alcohol abuse  F10.10     7. Cigarette smoker  F17.210         RECOMMENDATIONS: Frederick Alvarez is a 57 y.o. Caucasian male whose past medical history and cardiac  risk factors include: Hypertension, history of TIA, alcohol abuse, aortic atherosclerosis, cigarette smoker.   Coronary calcification: Total CAC 858, 97th percentile.  Continue aspirin and statin therapy. Echo and stress test results reviewed as part of medical decision making at today's office visit. Reemphasized the importance of secondary prevention.   TIA (transient ischemic attack) Index event monitor 2024 Currently on aspirin and statin therapy. Zio patch - No documented atrial fibrillation during the monitoring period.   He is monitor his Afib burden w/ an iWatch going forward. He wants to hold of on ILR implant.  He does not want to proceed w/ TEE to evlauate for PFO. But will proceed w/ limited echo w/ bubble and TCD.   Atherosclerosis of carotid arteries Noted during the TIA workup. Carotid duplex noted  less than 15% plaque burden in the right ICA. Continue conservative management and improve modifiable cardiovascular risk factors.  Benign hypertension Educated him on the importance of blood pressure monitoring. Reemphasized importance of low-salt diet. Currently managed by primary care provider.  Mixed hyperlipidemia Currently on rosuvastatin. Given the degree of CAC Crestor was increased to 10 mg po at bedtime.  Labs in 6 weeks to re-evaluate Lipids and direct LDL.   Alcohol abuse Reemphasized importance of complete alcohol cessation. Currently consumes < 2 standard alcoholic beverages per day  Cigarette  smoker Tobacco cessation counseling: Currently smoking 0.5 packs/day   Patient was informed of the dangers of tobacco abuse including stroke, cancer, and MI, as well as benefits of tobacco cessation. Patient is willing to quit at this time. 7 mins were spent counseling patient cessation techniques. We discussed various methods to help quit smoking, including deciding on a date to quit, joining a support group, pharmacological agents- nicotine gum/patch/lozenges.   I will reassess his progress at the next follow-up visit  FINAL MEDICATION LIST END OF ENCOUNTER: No orders of the defined types were placed in this encounter.   There are no discontinued medications.    Current Outpatient Medications:    amLODipine (NORVASC) 5 MG tablet, Take 5 mg by mouth daily., Disp: , Rfl:    aspirin EC 81 MG tablet, Take 1 tablet (81 mg total) by mouth daily. Swallow whole., Disp: 30 tablet, Rfl: 0   folic acid (FOLVITE) 1 MG tablet, Take 1 tablet (1 mg total) by mouth daily., Disp: 90 tablet, Rfl: 3   olmesartan (BENICAR) 40 MG tablet, Take 40 mg by mouth daily., Disp: , Rfl:    rosuvastatin (CRESTOR) 10 MG tablet, Take 1 tablet (10 mg total) by mouth at bedtime., Disp: 90 tablet, Rfl: 0  Orders Placed This Encounter  Procedures   Lipid Panel With LDL/HDL Ratio   LDL cholesterol, direct   [id number]+GEXB   ECHOCARDIOGRAM LIMITED BUBBLE STUDY   VAS [place] TRANSCRANIAL DOPPLER W BUBBLES    There are no Patient Instructions on file for this visit.   --Continue cardiac medications as reconciled in final medication list. --Return in about 8 weeks (around 09/30/2022) for TIA workup and review lipids . or sooner if needed. --Continue follow-up with your primary care physician regarding the management of your other chronic comorbid conditions.  Patient's questions and concerns were addressed to his satisfaction. He voices understanding of the instructions provided during this encounter.   This note was created using a voice recognition software as a result there may be grammatical errors inadvertently enclosed that do not reflect the nature of this encounter. Every attempt is made to correct such errors.  Tessa Lerner, [place], [facility]  Pager:  [phone] Office: [phone]

## 2022-08-10 ENCOUNTER — Encounter: Payer: Self-pay | Admitting: Cardiology

## 2022-08-28 ENCOUNTER — Ambulatory Visit (HOSPITAL_COMMUNITY)
Admission: RE | Admit: 2022-08-28 | Discharge: 2022-08-28 | Disposition: A | Discharge status: Home / Self Care | Payer: Managed Care, Other (non HMO) | Source: Ambulatory Visit | Attending: Cardiology | Admitting: Cardiology

## 2022-08-28 ENCOUNTER — Ambulatory Visit (HOSPITAL_BASED_OUTPATIENT_CLINIC_OR_DEPARTMENT_OTHER)
Admission: RE | Admit: 2022-08-28 | Discharge: 2022-08-28 | Disposition: A | Discharge status: Home / Self Care | Payer: Managed Care, Other (non HMO) | Source: Ambulatory Visit | Attending: Cardiology | Admitting: Cardiology

## 2022-08-28 DIAGNOSIS — G459 Transient cerebral ischemic attack, unspecified: Secondary | ICD-10-CM | POA: Insufficient documentation

## 2022-08-28 DIAGNOSIS — I639 Cerebral infarction, unspecified: Secondary | ICD-10-CM | POA: Diagnosis not present

## 2022-08-28 NOTE — Progress Notes (Signed)
VASCULAR LAB    TCD bubble study has been performed.  See CV proc for preliminary results.   Rawad Bochicchio, RVT 08/28/2022, 1:30 PM

## 2022-09-01 LAB — ECHOCARDIOGRAM LIMITED BUBBLE STUDY: S' Lateral: 3.1 cm

## 2022-09-04 DIAGNOSIS — G459 Transient cerebral ischemic attack, unspecified: Secondary | ICD-10-CM | POA: Insufficient documentation

## 2022-09-15 ENCOUNTER — Other Ambulatory Visit: Payer: Self-pay | Admitting: Cardiology

## 2022-09-15 DIAGNOSIS — E782 Mixed hyperlipidemia: Secondary | ICD-10-CM

## 2022-09-15 DIAGNOSIS — R931 Abnormal findings on diagnostic imaging of heart and coronary circulation: Secondary | ICD-10-CM

## 2022-09-15 DIAGNOSIS — I2510 Atherosclerotic heart disease of native coronary artery without angina pectoris: Secondary | ICD-10-CM

## 2022-09-17 ENCOUNTER — Other Ambulatory Visit: Payer: Self-pay | Admitting: Cardiology

## 2022-09-17 DIAGNOSIS — R931 Abnormal findings on diagnostic imaging of heart and coronary circulation: Secondary | ICD-10-CM

## 2022-09-17 DIAGNOSIS — I2510 Atherosclerotic heart disease of native coronary artery without angina pectoris: Secondary | ICD-10-CM

## 2022-09-17 DIAGNOSIS — E782 Mixed hyperlipidemia: Secondary | ICD-10-CM

## 2022-09-17 NOTE — Progress Notes (Signed)
Called pt and LVM

## 2022-09-17 NOTE — Progress Notes (Signed)
Pt Called and received his results and left appointment

## 2022-09-30 ENCOUNTER — Encounter: Payer: Self-pay | Admitting: Cardiology

## 2022-09-30 ENCOUNTER — Ambulatory Visit: Payer: Managed Care, Other (non HMO) | Admitting: Cardiology

## 2022-09-30 VITALS — BP 145/80 | HR 80 | Resp 16 | Ht 70.0 in | Wt 115.0 lb

## 2022-09-30 DIAGNOSIS — I2510 Atherosclerotic heart disease of native coronary artery without angina pectoris: Secondary | ICD-10-CM

## 2022-09-30 DIAGNOSIS — F1010 Alcohol abuse, uncomplicated: Secondary | ICD-10-CM

## 2022-09-30 DIAGNOSIS — F17210 Nicotine dependence, cigarettes, uncomplicated: Secondary | ICD-10-CM

## 2022-09-30 DIAGNOSIS — I10 Essential (primary) hypertension: Secondary | ICD-10-CM

## 2022-09-30 DIAGNOSIS — G459 Transient cerebral ischemic attack, unspecified: Secondary | ICD-10-CM

## 2022-09-30 DIAGNOSIS — I6523 Occlusion and stenosis of bilateral carotid arteries: Secondary | ICD-10-CM

## 2022-09-30 DIAGNOSIS — R931 Abnormal findings on diagnostic imaging of heart and coronary circulation: Secondary | ICD-10-CM

## 2022-09-30 DIAGNOSIS — E782 Mixed hyperlipidemia: Secondary | ICD-10-CM

## 2022-09-30 MED ORDER — ROSUVASTATIN CALCIUM 20 MG PO TABS: 20.0000 mg | ORAL_TABLET | Freq: Every day | ORAL | 0 refills | Status: DC

## 2022-09-30 NOTE — Progress Notes (Signed)
ID:  Frederick Alvarez, DOB 12/13/1965, MRN [id number]  PCP:  Noberto Retort, MD  Cardiologist:  Tessa Lerner, DO, [facility] (established care 05/06/2022)  Date: 09/30/22 Last Office Visit: 08/05/2022  Chief Complaint  Patient presents with   Hyperlipidemia    CAC and TIA     HPI  Frederick Alvarez is a 57 y.o. Caucasian male whose past medical history and cardiovascular risk factors include: Coronary artery calcification, hypertension, history of TIA, alcohol abuse, aortic atherosclerosis, cigarette smoker.   In March 2024 patient presented to [facility], ED with symptoms of dizziness, trouble speaking, left-sided weakness based on the workup he was diagnosed with TIA at the time of discharge.  He was referred to cardiology for further evaluation and management.  Since establishing care patient has undergone appropriate cardiovascular workup as outlined below.  He has been working on improving his modifiable cardiovascular risk factors: Lipid management has been uptitrated, he is reducing the consumption of alcohol and motivated to quit smoking.  He has refused transesophageal echocardiogram to evaluate for PFO in the setting of TIA.  At the last office visit the shared decision was to proceed with transcranial Doppler and a limited echocardiogram.  Denies chest pain or heart failure symptoms at the last office visit.  No focal neurological deficits.  Has reduced his cigarette smoking to 10 cigarettes/day and alcohol consumption to no more than 3 drinks per day.  He did get nicotine patches over-the-counter which she will start shortly.  At the last visit Crestor was increased from 5 mg p.o. daily at bedtime to 10 mg p.o. nightly.  Repeat labs note very minimal improvement in LDL levels.  Labs from 09/15/2022 independently reviewed.  Office blood pressures are not at goal.  Patient states that when the blood pressures are checked at different provider offices or at work SBP's 120-130  mmHg.  ALLERGIES: No Known Allergies  MEDICATION LIST PRIOR TO VISIT: Current Meds  Medication Sig   amLODipine (NORVASC) 5 MG tablet Take 5 mg by mouth daily.   aspirin EC 81 MG tablet Take 1 tablet (81 mg total) by mouth daily. Swallow whole.   folic acid (FOLVITE) 1 MG tablet Take 1 tablet (1 mg total) by mouth daily.   olmesartan (BENICAR) 40 MG tablet Take 40 mg by mouth daily.   rosuvastatin (CRESTOR) 20 MG tablet Take 1 tablet (20 mg total) by mouth at bedtime.   [DISCONTINUED] rosuvastatin (CRESTOR) 10 MG tablet TAKE 1 TABLET(10 MG) BY MOUTH AT BEDTIME     PAST MEDICAL HISTORY: Past Medical History:  Diagnosis Date   Atherosclerosis of aorta (HCC)    Coronary artery calcification    Hyperlipidemia    Hypertension    TIA (transient ischemic attack)     PAST SURGICAL HISTORY: History reviewed. No pertinent surgical history.  FAMILY HISTORY: The patient family history includes Healthy in his mother; Heart attack in his father.  SOCIAL HISTORY:  The patient  reports that he has been smoking cigarettes. He has never used smokeless tobacco. He reports that he does not currently use alcohol. He reports that he does not use drugs.  REVIEW OF SYSTEMS: Review of Systems  Cardiovascular:  Negative for chest pain, claudication, dyspnea on exertion, irregular heartbeat, leg swelling, near-syncope, orthopnea, palpitations, paroxysmal nocturnal dyspnea and syncope.  Respiratory:  Negative for shortness of breath.   Hematologic/Lymphatic: Negative for bleeding problem.  Musculoskeletal:  Negative for muscle cramps and myalgias.  Neurological:  Negative for dizziness and  light-headedness.    PHYSICAL EXAM:    09/30/2022   10:53 AM 09/30/2022   10:50 AM 08/05/2022   10:30 AM  Vitals with BMI  Height  5\' 10"  5\' 10"   Weight  115 lbs 112 lbs 3 oz  BMI  16.5 16.1  Systolic 145 154 161  Diastolic 80 83 83  Pulse 80 83 90    Physical Exam  Constitutional: No distress.  Age  appropriate, hemodynamically stable.   Neck: No JVD present.  Cardiovascular: Normal rate, regular rhythm, S1 normal, S2 normal and intact distal pulses. Exam reveals no gallop, no S3 and no S4.  No murmur heard. Pulses:      Dorsalis pedis pulses are 1+ on the right side and 1+ on the left side.       Posterior tibial pulses are 1+ on the right side and 1+ on the left side.  Pulmonary/Chest: Effort normal and breath sounds normal. No stridor. He has no wheezes. He has no rales.  Abdominal: Soft. Bowel sounds are normal. He exhibits no distension. There is no abdominal tenderness.  Musculoskeletal:        General: No edema.     Cervical back: Neck supple.  Neurological: He is alert and oriented to person, place, and time. He has intact cranial nerves (2-12).  Skin: Skin is warm and moist.   CARDIAC DATABASE: EKG: Jun 17, 2022: Sinus rhythm, 78 bpm, normal axis, poor R wave progression, without underlying ischemia injury pattern.  Echocardiogram: 04/17/2022: LVEF 60 to 65%, diastolic function normal, RVSP 33.5 mmHg, mild MR, mild to moderate TR, estimated RAP 3 mmHg.  08/28/2022:  1. LIMITED ECHO.   2. Left ventricular ejection fraction, by estimation, is 55 to 60%. The left ventricle has normal function. The left ventricle has no regional wall motion abnormalities. Left ventricular diastolic function could not be evaluated.   3. Right ventricular systolic function is normal. The right ventricular size is normal. There is normal pulmonary artery systolic pressure. The estimated right ventricular systolic pressure is 33.2 mmHg.   4. The mitral valve is grossly normal.   5. The aortic valve is grossly normal. Aortic valve sclerosis is present, with no evidence of aortic valve stenosis.   6. The inferior vena cava is normal in size with greater than 50% respiratory variability, suggesting right atrial pressure of 3 mmHg.   7. Agitated saline contrast bubble study was negative, with no evidence  of any interatrial shunt.   Stress Testing: Regadenoson (with Mod Bruce protocol) Nuclear stress test 06/26/2022 Myocardial perfusion is normal. Overall LV systolic function is normal without regional wall motion abnormalities. Stress LV EF: 83%. Low risk study. Nondiagnostic ECG stress. The heart rate response was consistent with Regadenoson. The blood pressure response was physiologic. No previous exam available for comparison.    Heart Catheterization: None  Cardiac monitor (Zio Patch): May 19, 2022 -June 01, 2022 Dominant rhythm sinus.  Heart rate 53-200 bpm. Avg HR 75 bpm. No atrial fibrillation, ventricular tachycardia, high grade AV block, pauses (3 seconds or longer). Total ventricular ectopic burden <1%. Episodes of paroxysmal supraventricular tachycardia.  Fastest episode was 9 beats, 2.8 seconds, max heart rate 200 bpm on 05/31/2022 at 12:25 PM.  Longest SVT episode 22 beats, 14.2 seconds, max heart rate 105 bpm, likely atrial tachycardia.  Total supraventricular ectopic burden <1%. Patient triggered events: 0.   Carotid artery duplex 05/19/2022: Duplex suggests stenosis in the right internal carotid artery (1-15%). No evidence of significant stenosis  in the left carotid vessels. There is moderate heterogeneous plaque noted in the bilateral carotid bulb without hemodynamically significant stenosis. Antegrade right vertebral artery flow. Antegrade left vertebral artery flow   CT Cardiac Scoring: June 03, 2022 Coronary Calcium Score: Left main: 0 Left anterior descending artery: 858 Left circumflex artery: 0 Right coronary artery: 0  Total: 858 Percentile: 97th  Pericardium: Normal.  Ascending Aorta: Normal caliber.  Total CAC 858 AU, 97th percentile for patient's age, sex, and race. Noncardiac findings: No significant extracardiac findings within the visualized chest.   CTA head and neck March 2024: 1. No large vessel occlusion or proximal hemodynamically  significant stenosis. 2. No evidence of core infarct or penumbra on CT perfusion. 3. Bilateral carotid bifurcation atherosclerosis without greater than 50% stenosis. 4. Approximately 6 mm pulmonary nodule at the left lung apex. Non-contrast chest CT at 6-12 months is recommended. If the noduleis stable at time of repeat CT, then future CT at 18-24 months (from today's scan) is considered optional for low-risk patients, but is recommended for high-risk patients. This recommendation follows the consensus statement: Guidelines for Management of Incidental Pulmonary Nodules Detected on CT Images: From the Fleischner Society 2017; Radiology 2017; 284:228-243. 5. Aortic Atherosclerosis (ICD10-I70.0) and Emphysema (ICD10-J43.9).  MRI of the brain with and without contrast: 04/16/2022 1. No acute intracranial process. 2. Small air-fluid level in the left maxillary sinus, which is nonspecific but can be seen in the setting of acute sinusitis.  Transcranial Doppler: August 28, 2022 No HITS at rest or during Valsalva. Negative transcranial Doppler Bubble study with no evidence of right to left intracardiac communication.     LABORATORY DATA:    Latest Ref Rng & Units 04/16/2022   11:30 PM 04/16/2022    3:06 PM 03/21/2022    4:01 PM  CBC  WBC 4.0 - 10.5 K/uL 4.1  5.1  4.9   Hemoglobin 13.0 - 17.0 g/dL 40.9  81.1  91.4   Hematocrit 39.0 - 52.0 % 38.2  40.0  37.0   Platelets 150 - 400 K/uL 216  248  268        Latest Ref Rng & Units 09/15/2022   11:19 AM 06/23/2022   12:27 PM 04/16/2022   11:30 PM  CMP  Glucose 70 - 99 mg/dL 782  956    BUN 6 - 24 mg/dL 5  5    Creatinine 2.13 - 1.27 mg/dL 0.86  5.78  4.69   Sodium 134 - 144 mmol/L 132  136    Potassium 3.5 - 5.2 mmol/L 4.4  4.1    Chloride 96 - 106 mmol/L 93  97    CO2 20 - 29 mmol/L 22     Calcium 8.7 - 10.2 mg/dL 9.8  9.5    Total Protein 6.0 - 8.5 g/dL 7.4  7.0    Total Bilirubin 0.0 - 1.2 mg/dL 0.6  0.4    Alkaline Phos 44 - 121 IU/L 80  75     AST 0 - 40 IU/L 24  18    ALT 0 - 44 IU/L 14       Lipid Panel  Lab Results  Component Value Date   CHOL 196 09/15/2022   HDL 117 09/15/2022   LDLCALC 67 09/15/2022   LDLDIRECT 75 09/15/2022   TRIG 63 09/15/2022   CHOLHDL 1.7 04/17/2022    No components found for: "NTPROBNP" No results for input(s): "PROBNP" in the last 8760 hours. Recent Labs    04/17/22 0445  TSH 3.810  3.660    BMP Recent Labs    03/21/22 1601 04/16/22 1506 04/16/22 2330 06/23/22 1227 09/15/22 1119  NA 127* 132*  --  136 132*  K 3.6 3.6  --  4.1 4.4  CL 92* 97*  --  97 93*  CO2 24 25  --   --  22  GLUCOSE 116* 116*  --  123* 114*  BUN <5* 5*  --  5* 5*  CREATININE 0.64 0.64 0.55* 0.73* 0.65*  CALCIUM 8.6* 9.0  --  9.5 9.8  GFRNONAA >60 >60 >60  --   --     HEMOGLOBIN A1C Lab Results  Component Value Date   HGBA1C 4.9 04/17/2022   MPG 94 04/17/2022   Comp Metabolic Panel Reviewed date:04/23/2022 08:14:02 PM Interpretation:BS 126, GFR 111 Performing Lab: Notes/Report: Testing performed at: [BN] [facility], [address] [place], [place], [address], Phone: [phone], Laboratory Director: Jolene Schimke, MD  Glucose 126 70-99 mg/dL    BUN 5 8-29 mg/dL    Creatinine 5.62 1.30-8.65 mg/dL    eGFR 784 >69 GE/XBM/8.41    Sodium 139 134-144 mmol/L    Potassium 4.0 3.5-5.2 mmol/L    Chloride 99 96-106 mmol/L    Carbon Dioxide, Total 24 20-29 mmol/L    Calcium 9.7 8.7-10.2 mg/dL    CA-corrected 3.24 4.01-02.72 mg/dL    Protein, Total 7.0 5.3-6.6 g/dL    Albumin 4.5 4.4-0.3 g/dL    Bilirubin, Total 0.3 0.0-1.2 mg/dL    Alkaline Phosphatase 86 44-121 IU/L    AST (SGOT) 23 0-40 IU/L    ALT (SGPT) 15 0-44 IU/L    Lipid Panel w/reflex Reviewed date:04/23/2022 08:14:32 PM Interpretation:HDL 101, LDL 97 Performing Lab: Notes/Report: Testing performed at: [BN] [facility], [address] [place], [place], [address], Phone: [phone], Laboratory Director: Jolene Schimke, MD  Cholesterol, Total 218 100-199 mg/dL    CHOL/HDL 2.2 1.8-8.4 Ratio    HDL Cholesterol 101 >39 mg/dL    Triglycerides 166 0-630 mg/dL    NHDL 160 1-093 mg/dL Range dependent upon risk factors.  LDL Chol Calc (NIH) 97 0-99 mg/dL      IMPRESSION:    [id number]-[id number]   1. Coronary atherosclerosis due to calcified coronary lesion  I25.10 rosuvastatin (CRESTOR) 20 MG tablet   I25.84 Lipid Panel With LDL/HDL Ratio    LDL cholesterol, direct    CMP14+EGFR    2. Agatston CAC score, >400  R93.1 rosuvastatin (CRESTOR) 20 MG tablet    Lipid Panel With LDL/HDL Ratio    LDL cholesterol, direct    CMP14+EGFR    3. TIA (transient ischemic attack)  G45.9 rosuvastatin (CRESTOR) 20 MG tablet    Lipid Panel With LDL/HDL Ratio    LDL cholesterol, direct    CMP14+EGFR    4. Atherosclerosis of both carotid arteries  I65.23 rosuvastatin (CRESTOR) 20 MG tablet    5. Benign hypertension  I10     6. Mixed hyperlipidemia  E78.2 Lipid Panel With LDL/HDL Ratio    LDL cholesterol, direct    CMP14+EGFR    7. Alcohol abuse  F10.10     8. Cigarette smoker  F17.210          RECOMMENDATIONS: Frederick Alvarez is a 57 y.o. Caucasian male whose past medical history and cardiac risk factors include: Coronary artery calcification, Hypertension, history of TIA, alcohol abuse, aortic atherosclerosis, cigarette smoker.   Coronary calcification: Total CAC 858, 97th percentile.  Continue aspirin 81 mg p.o. daily. Will increase Crestor  from 10 mg p.o. nightly to 20 mg p.o. nightly to target a goal LDL <55 mg/dL in the setting of CAC and recent TIA. Has undergone appropriate ischemic workup as outlined above. Reemphasized the importance of secondary prevention with focus on improving her modifiable cardiovascular risk factors such as glycemic control, lipid management, blood pressure control, weight loss.  TIA (transient ischemic attack) Index event monitor 2024 Currently on aspirin and statin  therapy. Zio patch - No documented atrial fibrillation during the monitoring period.   He is monitoring his Afib burden w/ an iWatch going forward. He wants to hold of on ILR implant.  Refused to undergo transesophageal echocardiogram. Alternative was a limited echo and transcranial Doppler both which had a low probability for PFO  Atherosclerosis of carotid arteries Noted during the TIA workup. Carotid duplex noted  less than 15% plaque burden in the right ICA. Continue conservative management and improve modifiable cardiovascular risk factors.  Benign hypertension Office blood pressures are currently not at goal. States that the blood pressures outside in the office are well-controlled. Educated him on the importance of blood pressure monitoring. Reemphasized importance of low-salt diet. Currently managed by primary care provider.  Mixed hyperlipidemia Currently on rosuvastatin. Crestor was increased to 10 mg p.o. every afternoon & follow-up  labs 09/15/2022 notes a direct LDL 75 mg/dL. Increase Crestor to 20 mg p.o. nightly  Alcohol abuse Reemphasized importance of complete alcohol cessation. Currently consumes < 2 standard alcoholic beverages per day  Cigarette smoker Tobacco cessation counseling: Currently smoking 0.5 packs/day   Patient was informed of the dangers of tobacco abuse including stroke, cancer, and MI, as well as benefits of tobacco cessation. Patient is willing to quit at this time. 5 mins were spent counseling patient cessation techniques.  Patient states that he did buy nicotine patches over-the-counter. Recommended as needed nicotine lozenges -he will call us back if he is requiring a prescription I will reassess his progress at the next follow-up visit  FINAL MEDICATION LIST END OF ENCOUNTER: Meds ordered this encounter  Medications   rosuvastatin (CRESTOR) 20 MG tablet    Sig: Take 1 tablet (20 mg total) by mouth at bedtime.    Dispense:  90 tablet     Refill:  0    Medications Discontinued During This Encounter  Medication Reason   rosuvastatin (CRESTOR) 10 MG tablet Dose change      Current Outpatient Medications:    amLODipine (NORVASC) 5 MG tablet, Take 5 mg by mouth daily., Disp: , Rfl:    aspirin EC 81 MG tablet, Take 1 tablet (81 mg total) by mouth daily. Swallow whole., Disp: 30 tablet, Rfl: 0   folic acid (FOLVITE) 1 MG tablet, Take 1 tablet (1 mg total) by mouth daily., Disp: 90 tablet, Rfl: 3   olmesartan (BENICAR) 40 MG tablet, Take 40 mg by mouth daily., Disp: , Rfl:    rosuvastatin (CRESTOR) 20 MG tablet, Take 1 tablet (20 mg total) by mouth at bedtime., Disp: 90 tablet, Rfl: 0  Orders Placed This Encounter  Procedures   Lipid Panel With LDL/HDL Ratio   LDL cholesterol, direct   [id number]    There are no Patient Instructions on file for this visit.   --Continue cardiac medications as reconciled in final medication list. --Return in about 6 months (around 04/02/2023) for Follow up, Coronary artery calcification. or sooner if needed. --Continue follow-up with your primary care physician regarding the management of your other chronic comorbid conditions.  Patient's questions and  concerns were addressed to his satisfaction. He voices understanding of the instructions provided during this encounter.   This note was created using a voice recognition software as a result there may be grammatical errors inadvertently enclosed that do not reflect the nature of this encounter. Every attempt is made to correct such errors.  Tessa Lerner, [place], [facility]  Pager:  [phone] Office: [phone]

## 2022-10-15 ENCOUNTER — Other Ambulatory Visit: Payer: Self-pay | Admitting: Physician Assistant

## 2022-10-15 DIAGNOSIS — F17200 Nicotine dependence, unspecified, uncomplicated: Secondary | ICD-10-CM

## 2022-10-15 DIAGNOSIS — R911 Solitary pulmonary nodule: Secondary | ICD-10-CM

## 2022-10-23 ENCOUNTER — Encounter: Payer: Self-pay | Admitting: Physician Assistant

## 2022-11-03 ENCOUNTER — Ambulatory Visit
Admission: RE | Admit: 2022-11-03 | Discharge: 2022-11-03 | Disposition: A | Discharge status: Home / Self Care | Payer: Managed Care, Other (non HMO) | Source: Ambulatory Visit | Attending: Physician Assistant | Admitting: Physician Assistant

## 2022-11-03 DIAGNOSIS — F17200 Nicotine dependence, unspecified, uncomplicated: Secondary | ICD-10-CM

## 2022-11-03 DIAGNOSIS — R911 Solitary pulmonary nodule: Secondary | ICD-10-CM

## 2023-01-19 ENCOUNTER — Other Ambulatory Visit: Payer: Self-pay | Admitting: Cardiology

## 2023-01-19 DIAGNOSIS — I2510 Atherosclerotic heart disease of native coronary artery without angina pectoris: Secondary | ICD-10-CM

## 2023-01-19 DIAGNOSIS — R931 Abnormal findings on diagnostic imaging of heart and coronary circulation: Secondary | ICD-10-CM

## 2023-01-19 DIAGNOSIS — G459 Transient cerebral ischemic attack, unspecified: Secondary | ICD-10-CM

## 2023-01-19 DIAGNOSIS — I6523 Occlusion and stenosis of bilateral carotid arteries: Secondary | ICD-10-CM

## 2023-03-30 ENCOUNTER — Ambulatory Visit: Payer: Self-pay | Admitting: Cardiology

## 2023-05-29 ENCOUNTER — Telehealth: Payer: Self-pay | Admitting: Cardiology

## 2023-05-29 NOTE — Telephone Encounter (Signed)
 I do not see where a monitor was ordered.

## 2023-05-29 NOTE — Telephone Encounter (Signed)
 They are calling because they need DX code for the heart monitor per pt's insurance. Fax # [phone]

## 2023-06-11 ENCOUNTER — Other Ambulatory Visit: Payer: Self-pay

## 2023-06-11 NOTE — Telephone Encounter (Signed)
 Spoke with [employer] and they had stated this was in regard to the monitor from April 2024 because insurance is now denying it. New dx codes based on the reason for Dr. Albert Huff ordering this monitor were given to [employer] to try (I48.91 and I49.9).

## 2023-11-03 ENCOUNTER — Other Ambulatory Visit: Payer: Self-pay | Admitting: Physician Assistant

## 2023-11-03 DIAGNOSIS — R911 Solitary pulmonary nodule: Secondary | ICD-10-CM

## 2023-11-10 ENCOUNTER — Encounter: Payer: Self-pay | Admitting: Physician Assistant

## 2023-11-13 ENCOUNTER — Ambulatory Visit
Admission: RE | Admit: 2023-11-13 | Discharge: 2023-11-13 | Disposition: A | Discharge status: Home / Self Care | Source: Ambulatory Visit | Attending: Physician Assistant | Admitting: Physician Assistant

## 2023-11-13 DIAGNOSIS — R911 Solitary pulmonary nodule: Secondary | ICD-10-CM

## 2023-11-13 MED ORDER — IOPAMIDOL (ISOVUE-300) INJECTION 61%
100.0000 mL | Freq: Once | INTRAVENOUS | Status: AC | PRN
  Administered 2023-11-13: 100 mL via INTRAVENOUS
